# Patient Record
Sex: Female | Born: 1990
Health system: Southern US, Community
[De-identification: ages and names within clinical notes are randomized; demographics above are authoritative.]

## PROBLEM LIST (undated history)

## (undated) ENCOUNTER — Ambulatory Visit: Admission: EM | Payer: 59 | Source: Home / Self Care

## (undated) DIAGNOSIS — Z3491 Encounter for supervision of normal pregnancy, unspecified, first trimester: Principal | ICD-10-CM

## (undated) DIAGNOSIS — Z789 Other specified health status: Secondary | ICD-10-CM

## (undated) DIAGNOSIS — F419 Anxiety disorder, unspecified: Secondary | ICD-10-CM

## (undated) HISTORY — PX: NO PAST SURGERIES: SHX2092

## (undated) HISTORY — DX: Encounter for supervision of normal pregnancy, unspecified, first trimester: Z34.91

## (undated) HISTORY — DX: Other specified health status: Z78.9

---
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
## 2013-03-06 NOTE — L&D Delivery Note (Signed)
I have seen and examined this patient and I agree with the above. Cam Hai CNM 3:25 AM 11/15/2013

## 2013-03-06 NOTE — L&D Delivery Note (Signed)
Delivery Note Pt was noted to be complete and pushing. At 2:48 AM a viable female was delivered via Vaginal, Spontaneous Delivery (Presentation: ROA  ). Thick meconium noted after extraction of infant. Pt with good heart rate but no tone and agonal respirations. A code apgar was called.  APGAR: 3, 8 and NICU team present for fetal assessment. weight is pending.   Placenta status: intact.  Cord: 3 vessels with the following complications: .  Cord pH: n/a  Anesthesia: Epidural  Episiotomy: none Lacerations: 1st degree hemostatic perineal laceration Suture Repair: n/a Est. Blood Loss (mL): 300  Mom to postpartum.  Baby to Couplet care / Skin to Skin.  Philipp Deputy, CNM, present for entire delivery.  Tomma Rakers, MD, PGY3 11/15/2013, 3:07 AM

## 2013-04-10 ENCOUNTER — Other Ambulatory Visit: Payer: Self-pay | Admitting: Obstetrics & Gynecology

## 2013-04-10 DIAGNOSIS — O3680X0 Pregnancy with inconclusive fetal viability, not applicable or unspecified: Secondary | ICD-10-CM

## 2013-04-11 ENCOUNTER — Ambulatory Visit (INDEPENDENT_AMBULATORY_CARE_PROVIDER_SITE_OTHER): Payer: Medicaid Other

## 2013-04-11 DIAGNOSIS — O3680X0 Pregnancy with inconclusive fetal viability, not applicable or unspecified: Secondary | ICD-10-CM

## 2013-04-11 NOTE — Progress Notes (Signed)
U/S-single IUP with +FCA noted, FHR- 179 bpm, cx appears closed (3.4cm), bilateral adnexa wnl, CRL c/w 9+0wks EDD 11/14/2013

## 2013-04-18 ENCOUNTER — Other Ambulatory Visit (HOSPITAL_COMMUNITY)
Admission: RE | Admit: 2013-04-18 | Discharge: 2013-04-18 | Disposition: A | Discharge status: Home / Self Care | Payer: Medicaid Other | Source: Ambulatory Visit | Attending: Adult Health | Admitting: Adult Health

## 2013-04-18 ENCOUNTER — Ambulatory Visit (INDEPENDENT_AMBULATORY_CARE_PROVIDER_SITE_OTHER): Payer: Medicaid Other | Admitting: Adult Health

## 2013-04-18 ENCOUNTER — Encounter: Payer: Self-pay | Admitting: Adult Health

## 2013-04-18 VITALS — BP 118/60 | Ht 66.0 in | Wt 190.0 lb

## 2013-04-18 DIAGNOSIS — Z01419 Encounter for gynecological examination (general) (routine) without abnormal findings: Secondary | ICD-10-CM

## 2013-04-18 DIAGNOSIS — Z3400 Encounter for supervision of normal first pregnancy, unspecified trimester: Secondary | ICD-10-CM

## 2013-04-18 DIAGNOSIS — Z3491 Encounter for supervision of normal pregnancy, unspecified, first trimester: Secondary | ICD-10-CM

## 2013-04-18 DIAGNOSIS — Z1389 Encounter for screening for other disorder: Secondary | ICD-10-CM

## 2013-04-18 DIAGNOSIS — Z113 Encounter for screening for infections with a predominantly sexual mode of transmission: Secondary | ICD-10-CM | POA: Insufficient documentation

## 2013-04-18 DIAGNOSIS — Z331 Pregnant state, incidental: Secondary | ICD-10-CM

## 2013-04-18 HISTORY — DX: Encounter for supervision of normal pregnancy, unspecified, first trimester: Z34.91

## 2013-04-18 LAB — CBC
HEMATOCRIT: 37.2 % (ref 36.0–46.0)
HEMOGLOBIN: 12.9 g/dL (ref 12.0–15.0)
MCH: 29.7 pg (ref 26.0–34.0)
MCHC: 34.7 g/dL (ref 30.0–36.0)
MCV: 85.7 fL (ref 78.0–100.0)
Platelets: 279 10*3/uL (ref 150–400)
RBC: 4.34 MIL/uL (ref 3.87–5.11)
RDW: 12.9 % (ref 11.5–15.5)
WBC: 11.6 10*3/uL — AB (ref 4.0–10.5)

## 2013-04-18 LAB — POCT URINALYSIS DIPSTICK
Blood, UA: NEGATIVE
Glucose, UA: NEGATIVE
KETONES UA: NEGATIVE
Leukocytes, UA: NEGATIVE
Nitrite, UA: NEGATIVE
Protein, UA: NEGATIVE

## 2013-04-18 LAB — HEPATITIS B SURFACE ANTIGEN: Hepatitis B Surface Ag: NEGATIVE

## 2013-04-18 LAB — RPR

## 2013-04-18 LAB — TSH: TSH: 1.246 u[IU]/mL (ref 0.350–4.500)

## 2013-04-18 LAB — HIV ANTIBODY (ROUTINE TESTING W REFLEX): HIV: NONREACTIVE

## 2013-04-18 NOTE — Progress Notes (Signed)
Ears have wax bilaterally, use warm water to rinse out. Pt says she has chest pain since she was a child, this is not new.

## 2013-04-18 NOTE — Patient Instructions (Signed)

## 2013-04-18 NOTE — Progress Notes (Signed)
Subjective:  Alexandra Oneal is a 23 y.o. G1P0 Caucasian female at 5054w0d by US being seen today for her first obstetrical visit.  Her obstetrical history is significant for none.  Pregnancy history fully reviewed.  Patient reports nausea. Denies vb, cramping, uti s/s, abnormal/malodorous vag d/c, or vulvovaginal itching/irritation.  BP 118/60  Ht 5\' 6"  (1.676 m)  Wt 190 lb (86.183 kg)  BMI 30.68 kg/m2  HISTORY: OB History  Gravida Para Term Preterm AB SAB TAB Ectopic Multiple Living  1             # Outcome Date GA Lbr Len/2nd Weight Sex Delivery Anes PTL Lv  1 CUR              Past Medical History  Diagnosis Date  . Medical history non-contributory   . Supervision of normal pregnancy in first trimester 04/18/2013   Past Surgical History  Procedure Laterality Date  . No past surgeries     Family History  Problem Relation Age of Onset  . Heart disease Mother     heart attack  . Hypertension Mother   . Cancer Maternal Grandmother     breast  . Heart disease Maternal Grandfather     heart attack  . Alcohol abuse Paternal Grandfather   . Mental illness Sister     severe bipolar  . Mental illness Sister     Exam   System:     General: Well developed & nourished, no acute distress   Skin: Warm & dry, normal coloration and turgor, no rashes   Neurologic: Alert & oriented, normal mood   Cardiovascular: Regular rate & rhythm   Respiratory: Effort & rate normal, LCTAB, acyanotic   Abdomen: Soft, non tender   Extremities: normal strength, tone   Pelvic Exam:    Perineum: Normal perineum   Vulva: Normal, no lesions   Vagina:  Normal mucosa, normal discharge   Cervix: Normal, bulbous, appears closed   Uterus: Normal size/shape/contour for GA   Thin prep pap smear with GC/CHL FHR: 172 via doppler   Assessment:   Pregnancy: G1P0 Patient Active Problem List   Diagnosis Date Noted  . Supervision of normal pregnancy in first trimester 04/18/2013     7054w0d G1P0 New OB visit     Plan:  Initial labs drawn Continue prenatal vitamins Problem list reviewed and updated Reviewed n/v relief measures and warning s/s to report Reviewed recommended weight gain based on pre-gravid BMI Encouraged well-balanced diet Genetic Screening discussed Integrated Screen: requested Cystic fibrosis screening discussed requested Ultrasound discussed; fetal survey: requested Follow up in 2 weeks for IT/NT and see me  Adline PotterJennifer A. Verlyn Dannenberg, NP 04/18/2013 12:28 PM

## 2013-04-19 LAB — DRUG SCREEN, URINE, NO CONFIRMATION
Amphetamine Screen, Ur: NEGATIVE
Barbiturate Quant, Ur: NEGATIVE
Benzodiazepines.: NEGATIVE
COCAINE METABOLITES: NEGATIVE
Creatinine,U: 204.9 mg/dL
MARIJUANA METABOLITE: NEGATIVE
Methadone: NEGATIVE
Opiate Screen, Urine: NEGATIVE
PHENCYCLIDINE (PCP): NEGATIVE
Propoxyphene: NEGATIVE

## 2013-04-19 LAB — URINALYSIS, ROUTINE W REFLEX MICROSCOPIC
Bilirubin Urine: NEGATIVE
GLUCOSE, UA: NEGATIVE mg/dL
Hgb urine dipstick: NEGATIVE
Ketones, ur: NEGATIVE mg/dL
LEUKOCYTES UA: NEGATIVE
NITRITE: NEGATIVE
PH: 5 (ref 5.0–8.0)
Protein, ur: NEGATIVE mg/dL
Specific Gravity, Urine: 1.023 (ref 1.005–1.030)
Urobilinogen, UA: 0.2 mg/dL (ref 0.0–1.0)

## 2013-04-19 LAB — URINE CULTURE: Colony Count: 9000

## 2013-04-19 LAB — ABO AND RH: Rh Type: POSITIVE

## 2013-04-19 LAB — OXYCODONE SCREEN, UA, RFLX CONFIRM: Oxycodone Screen, Ur: NEGATIVE ng/mL

## 2013-04-19 LAB — VARICELLA ZOSTER ANTIBODY, IGG: VARICELLA IGG: 503.8 {index} — AB (ref <135.00)

## 2013-04-19 LAB — RUBELLA SCREEN: RUBELLA: 1.32 {index} — AB (ref <0.90)

## 2013-04-19 LAB — ANTIBODY SCREEN: ANTIBODY SCREEN: NEGATIVE

## 2013-04-22 LAB — CYSTIC FIBROSIS DIAGNOSTIC STUDY

## 2013-04-23 ENCOUNTER — Encounter: Payer: Self-pay | Admitting: Adult Health

## 2013-04-30 ENCOUNTER — Telehealth: Payer: Self-pay | Admitting: Obstetrics & Gynecology

## 2013-04-30 NOTE — Telephone Encounter (Signed)
Spoke with pt. + cramping. Sharp pains in bottom of stomach. Severe backpain. No bleeding. Advised with no bleeding, everything sounds good at this point. I spoke with Selena BattenKim, CNM and she advised Tylenol and warm baths. If she started with bleeding, let us know or if we're not in the office, [facility]. Again advised with no bleeding, that is a good sign. Pt voiced understanding. JSY

## 2013-04-30 NOTE — Telephone Encounter (Signed)
Left message x 1. JSY 

## 2013-05-02 ENCOUNTER — Encounter: Payer: Self-pay | Admitting: Adult Health

## 2013-05-02 ENCOUNTER — Other Ambulatory Visit: Payer: Self-pay | Admitting: Adult Health

## 2013-05-02 ENCOUNTER — Ambulatory Visit (INDEPENDENT_AMBULATORY_CARE_PROVIDER_SITE_OTHER): Payer: Medicaid Other | Admitting: Adult Health

## 2013-05-02 ENCOUNTER — Ambulatory Visit (INDEPENDENT_AMBULATORY_CARE_PROVIDER_SITE_OTHER): Payer: Medicaid Other

## 2013-05-02 VITALS — BP 124/70 | Wt 186.5 lb

## 2013-05-02 DIAGNOSIS — O9989 Other specified diseases and conditions complicating pregnancy, childbirth and the puerperium: Secondary | ICD-10-CM

## 2013-05-02 DIAGNOSIS — Z1389 Encounter for screening for other disorder: Secondary | ICD-10-CM

## 2013-05-02 DIAGNOSIS — H6690 Otitis media, unspecified, unspecified ear: Secondary | ICD-10-CM

## 2013-05-02 DIAGNOSIS — Z331 Pregnant state, incidental: Secondary | ICD-10-CM

## 2013-05-02 DIAGNOSIS — Z3491 Encounter for supervision of normal pregnancy, unspecified, first trimester: Secondary | ICD-10-CM

## 2013-05-02 DIAGNOSIS — Z36 Encounter for antenatal screening of mother: Secondary | ICD-10-CM

## 2013-05-02 LAB — POCT URINALYSIS DIPSTICK
Blood, UA: NEGATIVE
Glucose, UA: NEGATIVE
Ketones, UA: NEGATIVE
Leukocytes, UA: NEGATIVE
Nitrite, UA: NEGATIVE
PROTEIN UA: NEGATIVE

## 2013-05-02 MED ORDER — AZITHROMYCIN 250 MG PO TABS: ORAL_TABLET | ORAL | Status: DC

## 2013-05-02 NOTE — Patient Instructions (Signed)
Second Trimester of Pregnancy The second trimester is from week 13 through week 28, months 4 through 6. The second trimester is often a time when you feel your best. Your body has also adjusted to being pregnant, and you begin to feel better physically. Usually, morning sickness has lessened or quit completely, you may have more energy, and you may have an increase in appetite. The second trimester is also a time when the fetus is growing rapidly. At the end of the sixth month, the fetus is about 9 inches long and weighs about 1 pounds. You will likely begin to feel the baby move (quickening) between 18 and 20 weeks of the pregnancy. BODY CHANGES Your body goes through many changes during pregnancy. The changes vary from woman to woman.   Your weight will continue to increase. You will notice your lower abdomen bulging out.  You may begin to get stretch marks on your hips, abdomen, and breasts.  You may develop headaches that can be relieved by medicines approved by your caregiver.  You may urinate more often because the fetus is pressing on your bladder.  You may develop or continue to have heartburn as a result of your pregnancy.  You may develop constipation because certain hormones are causing the muscles that push waste through your intestines to slow down.  You may develop hemorrhoids or swollen, bulging veins (varicose veins).  You may have back pain because of the weight gain and pregnancy hormones relaxing your joints between the bones in your pelvis and as a result of a shift in weight and the muscles that support your balance.  Your breasts will continue to grow and be tender.  Your gums may bleed and may be sensitive to brushing and flossing.  Dark spots or blotches (chloasma, mask of pregnancy) may develop on your face. This will likely fade after the baby is born.  A dark line from your belly button to the pubic area (linea nigra) may appear. This will likely fade after the  baby is born.follow up in 4 weeks WHAT TO EXPECT AT YOUR PRENATAL VISITS During a routine prenatal visit:  You will be weighed to make sure you and the fetus are growing normally.  Your blood pressure will be taken.  Your abdomen will be measured to track your baby's growth.  The fetal heartbeat will be listened to.  Any test results from the previous visit will be discussed. Your caregiver may ask you:  How you are feeling.  If you are feeling the baby move.  If you have had any abnormal symptoms, such as leaking fluid, bleeding, severe headaches, or abdominal cramping.  If you have any questions. Other tests that may be performed during your second trimester include:  Blood tests that check for:  Low iron levels (anemia).  Gestational diabetes (between 24 and 28 weeks).  Rh antibodies.  Urine tests to check for infections, diabetes, or protein in the urine.  An ultrasound to confirm the proper growth and development of the baby.  An amniocentesis to check for possible genetic problems.  Fetal screens for spina bifida and Down syndrome. HOME CARE INSTRUCTIONS   Avoid all smoking, herbs, alcohol, and unprescribed drugs. These chemicals affect the formation and growth of the baby.  Follow your caregiver's instructions regarding medicine use. There are medicines that are either safe or unsafe to take during pregnancy.  Exercise only as directed by your caregiver. Experiencing uterine cramps is a good sign to stop exercising.  Continue to eat regular, healthy meals.  Wear a good support bra for breast tenderness.  Do not use hot tubs, steam rooms, or saunas.  Wear your seat belt at all times when driving.  Avoid raw meat, uncooked cheese, cat litter boxes, and soil used by cats. These carry germs that can cause birth defects in the baby.  Take your prenatal vitamins.  Try taking a stool softener (if your caregiver approves) if you develop constipation. Eat more  high-fiber foods, such as fresh vegetables or fruit and whole grains. Drink plenty of fluids to keep your urine clear or pale yellow.  Take warm sitz baths to soothe any pain or discomfort caused by hemorrhoids. Use hemorrhoid cream if your caregiver approves.  If you develop varicose veins, wear support hose. Elevate your feet for 15 minutes, 3 4 times a day. Limit salt in your diet.  Avoid heavy lifting, wear low heel shoes, and practice good posture.  Rest with your legs elevated if you have leg cramps or low back pain.  Visit your dentist if you have not gone yet during your pregnancy. Use a soft toothbrush to brush your teeth and be gentle when you floss.  A sexual relationship may be continued unless your caregiver directs you otherwise.  Continue to go to all your prenatal visits as directed by your caregiver. SEEK MEDICAL CARE IF:   You have dizziness.  You have mild pelvic cramps, pelvic pressure, or nagging pain in the abdominal area.  You have persistent nausea, vomiting, or diarrhea.  You have a bad smelling vaginal discharge.  You have pain with urination. SEEK IMMEDIATE MEDICAL CARE IF:   You have a fever.  You are leaking fluid from your vagina.  You have spotting or bleeding from your vagina.  You have severe abdominal cramping or pain.  You have rapid weight gain or loss.  You have shortness of breath with chest pain.  You notice sudden or extreme swelling of your face, hands, ankles, feet, or legs.  You have not felt your baby move in over an hour.  You have severe headaches that do not go away with medicine.  You have vision changes. Document Released: 02/14/2001 Document Revised: 10/23/2012 Document Reviewed: 04/23/2012 [facility] Patient Information 2014 [place].

## 2013-05-02 NOTE — Progress Notes (Signed)
Complains of pain right ear, ear canal red, less wax, has nasal congestion and green drainage,snezing too,no bleeding, had some cramps last night,will rx zpack and increase fluids.Has gotten humidifier.take Claritin or zyrtec.For IT/NT today return in 4 weeks for second IT draw

## 2013-05-02 NOTE — Progress Notes (Signed)
U/S(12+0wks)-single IUP with +FCA noted, FHR-167 bpm, cx appears long and closed (3.3cm), bilateral adnexa wnl, NB present, NT-1.8747mm, CRL c/w dates, anterior Gr 0 placenta

## 2013-05-07 LAB — MATERNAL SCREEN, INTEGRATED #1

## 2013-05-30 ENCOUNTER — Ambulatory Visit (INDEPENDENT_AMBULATORY_CARE_PROVIDER_SITE_OTHER): Payer: Medicaid Other | Admitting: Adult Health

## 2013-05-30 ENCOUNTER — Encounter (INDEPENDENT_AMBULATORY_CARE_PROVIDER_SITE_OTHER): Payer: Self-pay

## 2013-05-30 ENCOUNTER — Encounter: Payer: Self-pay | Admitting: Adult Health

## 2013-05-30 VITALS — BP 118/50 | Wt 181.5 lb

## 2013-05-30 DIAGNOSIS — Z331 Pregnant state, incidental: Secondary | ICD-10-CM

## 2013-05-30 DIAGNOSIS — O9989 Other specified diseases and conditions complicating pregnancy, childbirth and the puerperium: Secondary | ICD-10-CM

## 2013-05-30 DIAGNOSIS — Z1389 Encounter for screening for other disorder: Secondary | ICD-10-CM

## 2013-05-30 DIAGNOSIS — Z3400 Encounter for supervision of normal first pregnancy, unspecified trimester: Secondary | ICD-10-CM

## 2013-05-30 LAB — POCT URINALYSIS DIPSTICK
Blood, UA: NEGATIVE
Glucose, UA: NEGATIVE
KETONES UA: NEGATIVE
LEUKOCYTES UA: NEGATIVE
Nitrite, UA: NEGATIVE
Protein, UA: NEGATIVE

## 2013-05-30 NOTE — Progress Notes (Signed)
+  FHM on US 155, last night episode of feeling faint and clammy, eat every 2 hours with protein and fiber.2nd IT today will follow up in 4 weeks for US,No bleeding ,No pain

## 2013-05-30 NOTE — Addendum Note (Signed)
Addended by: Criss AlvinePULLIAM, Lorena Clearman G on: 05/30/2013 01:14 PM   Modules accepted: Orders

## 2013-05-30 NOTE — Patient Instructions (Signed)
Follow-up in 4 weeks for US

## 2013-06-03 ENCOUNTER — Encounter: Payer: Self-pay | Admitting: Obstetrics & Gynecology

## 2013-06-03 ENCOUNTER — Encounter (INDEPENDENT_AMBULATORY_CARE_PROVIDER_SITE_OTHER): Payer: Self-pay

## 2013-06-03 ENCOUNTER — Ambulatory Visit (INDEPENDENT_AMBULATORY_CARE_PROVIDER_SITE_OTHER): Payer: Medicaid Other | Admitting: Obstetrics & Gynecology

## 2013-06-03 VITALS — BP 110/70 | Wt 186.0 lb

## 2013-06-03 DIAGNOSIS — O9989 Other specified diseases and conditions complicating pregnancy, childbirth and the puerperium: Secondary | ICD-10-CM

## 2013-06-03 DIAGNOSIS — Z1389 Encounter for screening for other disorder: Secondary | ICD-10-CM

## 2013-06-03 DIAGNOSIS — F411 Generalized anxiety disorder: Secondary | ICD-10-CM

## 2013-06-03 DIAGNOSIS — Z331 Pregnant state, incidental: Secondary | ICD-10-CM

## 2013-06-03 LAB — POCT URINALYSIS DIPSTICK
Blood, UA: NEGATIVE
Glucose, UA: NEGATIVE
Ketones, UA: NEGATIVE
Leukocytes, UA: NEGATIVE
Nitrite, UA: NEGATIVE
PROTEIN UA: NEGATIVE

## 2013-06-03 NOTE — Progress Notes (Signed)
Pt having episodic anxiety, panic attacks with SOB, tachycardia visual changes etc Given strategies Fears of elevators and bridges also  Keep scheduled

## 2013-06-05 LAB — MATERNAL SCREEN, INTEGRATED #2
AFP MOM MAT SCREEN: 0.89
AFP, Serum: 26.7 ng/mL
Calculated Gestational Age: 16.3
Crown Rump Length: 59.2 mm
ESTRIOL FREE MAT SCREEN: 0.83 ng/mL
Estriol Mom: 1
INHIBIN A MOM MAT SCREEN: 1.98
Inhibin A Dimeric: 306 pg/mL
NT MoM: 1.07
NUCHAL TRANSLUCENCY MAT SCREEN 2: 1.47 mm
NUMBER OF FETUSES MAT SCREEN 2: 1
PAPP-A MAT SCREEN: 408 ng/mL
PAPP-A MOM MAT SCREEN: 0.62
Rish for ONTD: <1:5000 {titer}
hCG MoM: 1.11
hCG, Serum: 32.7 IU/mL

## 2013-06-09 ENCOUNTER — Encounter: Payer: Self-pay | Admitting: Adult Health

## 2013-06-24 ENCOUNTER — Other Ambulatory Visit: Payer: Self-pay | Admitting: Obstetrics & Gynecology

## 2013-06-24 DIAGNOSIS — Z1389 Encounter for screening for other disorder: Secondary | ICD-10-CM

## 2013-06-27 ENCOUNTER — Ambulatory Visit (INDEPENDENT_AMBULATORY_CARE_PROVIDER_SITE_OTHER): Payer: Medicaid Other | Admitting: Obstetrics & Gynecology

## 2013-06-27 ENCOUNTER — Encounter: Payer: Self-pay | Admitting: Obstetrics & Gynecology

## 2013-06-27 ENCOUNTER — Ambulatory Visit (INDEPENDENT_AMBULATORY_CARE_PROVIDER_SITE_OTHER): Payer: Medicaid Other

## 2013-06-27 VITALS — BP 138/78 | Wt 194.5 lb

## 2013-06-27 DIAGNOSIS — Z1389 Encounter for screening for other disorder: Secondary | ICD-10-CM

## 2013-06-27 DIAGNOSIS — Z331 Pregnant state, incidental: Secondary | ICD-10-CM

## 2013-06-27 DIAGNOSIS — Z3400 Encounter for supervision of normal first pregnancy, unspecified trimester: Secondary | ICD-10-CM

## 2013-06-27 LAB — POCT URINALYSIS DIPSTICK
Blood, UA: NEGATIVE
Glucose, UA: NEGATIVE
KETONES UA: NEGATIVE
LEUKOCYTES UA: NEGATIVE
Nitrite, UA: NEGATIVE
Protein, UA: NEGATIVE

## 2013-06-27 NOTE — Progress Notes (Signed)
Sonogram is reviewed and report done.  All normal

## 2013-06-27 NOTE — Progress Notes (Signed)
U/S(20+0wks)-single active fetus, meas c/w dates, fluid wnl,, anterior Gr 0 placenta, cx appears closed (3.4cm), FHR-156 bpm, female fetus, bilateral adnexa appears wnl, no obvious abnl noted sub-optimal views due to fetal position

## 2013-07-25 ENCOUNTER — Ambulatory Visit (INDEPENDENT_AMBULATORY_CARE_PROVIDER_SITE_OTHER): Payer: Self-pay | Admitting: Obstetrics & Gynecology

## 2013-07-25 ENCOUNTER — Encounter: Payer: Self-pay | Admitting: Obstetrics & Gynecology

## 2013-07-25 VITALS — BP 120/70 | Wt 202.0 lb

## 2013-07-25 DIAGNOSIS — Z3400 Encounter for supervision of normal first pregnancy, unspecified trimester: Secondary | ICD-10-CM

## 2013-07-25 NOTE — Progress Notes (Signed)
G1P0 [redacted]w[redacted]d Estimated Date of Delivery: 11/14/13   BP weight and urine results all reviewed and noted. Patient reports good fetal movement, denies any bleeding and no rupture of membranes symptoms or regular contractions.  Patient is without complaints. All questions were answered.  Follow up in 4 weeks for routine OB appt

## 2013-08-22 ENCOUNTER — Encounter: Payer: Medicaid Other | Admitting: Obstetrics and Gynecology

## 2013-08-22 ENCOUNTER — Other Ambulatory Visit: Payer: Medicaid Other

## 2013-08-22 DIAGNOSIS — Z3403 Encounter for supervision of normal first pregnancy, third trimester: Secondary | ICD-10-CM

## 2013-08-22 LAB — CBC
HCT: 34.9 % — ABNORMAL LOW (ref 36.0–46.0)
Hemoglobin: 11.9 g/dL — ABNORMAL LOW (ref 12.0–15.0)
MCH: 30.2 pg (ref 26.0–34.0)
MCHC: 34.1 g/dL (ref 30.0–36.0)
MCV: 88.6 fL (ref 78.0–100.0)
PLATELETS: 231 10*3/uL (ref 150–400)
RBC: 3.94 MIL/uL (ref 3.87–5.11)
RDW: 13.1 % (ref 11.5–15.5)
WBC: 12.8 10*3/uL — ABNORMAL HIGH (ref 4.0–10.5)

## 2013-08-23 LAB — ANTIBODY SCREEN: ANTIBODY SCREEN: NEGATIVE

## 2013-08-23 LAB — GLUCOSE TOLERANCE, 2 HOURS W/ 1HR
GLUCOSE: 152 mg/dL (ref 70–170)
Glucose, 2 hour: 119 mg/dL (ref 70–139)
Glucose, Fasting: 90 mg/dL (ref 70–99)

## 2013-08-23 LAB — RPR

## 2013-08-23 LAB — HIV ANTIBODY (ROUTINE TESTING W REFLEX): HIV: NONREACTIVE

## 2013-08-25 ENCOUNTER — Encounter: Payer: Self-pay | Admitting: Obstetrics and Gynecology

## 2013-08-25 ENCOUNTER — Ambulatory Visit (INDEPENDENT_AMBULATORY_CARE_PROVIDER_SITE_OTHER): Payer: Self-pay | Admitting: Obstetrics and Gynecology

## 2013-08-25 ENCOUNTER — Encounter: Payer: Self-pay | Admitting: Adult Health

## 2013-08-25 VITALS — BP 120/80 | Wt 211.0 lb

## 2013-08-25 DIAGNOSIS — Z3491 Encounter for supervision of normal pregnancy, unspecified, first trimester: Secondary | ICD-10-CM

## 2013-08-25 DIAGNOSIS — Z3480 Encounter for supervision of other normal pregnancy, unspecified trimester: Secondary | ICD-10-CM

## 2013-08-25 DIAGNOSIS — Z1389 Encounter for screening for other disorder: Secondary | ICD-10-CM

## 2013-08-25 DIAGNOSIS — Z331 Pregnant state, incidental: Secondary | ICD-10-CM

## 2013-08-25 LAB — POCT URINALYSIS DIPSTICK
Blood, UA: NEGATIVE
Glucose, UA: NEGATIVE
KETONES UA: NEGATIVE
Leukocytes, UA: NEGATIVE
Nitrite, UA: NEGATIVE
Protein, UA: NEGATIVE

## 2013-08-25 LAB — HSV 2 ANTIBODY, IGG: HSV 2 Glycoprotein G Ab, IgG: <0.1 IV

## 2013-08-25 NOTE — Progress Notes (Signed)
Pt states that she has a constant pain "down there " that feels like a pulled muscle and hurts really bad.

## 2013-08-25 NOTE — Patient Instructions (Signed)
Please check out [email or web address]   for more information on childbirth classes

## 2013-08-25 NOTE — Progress Notes (Signed)
G1P0 4347w3d Estimated Date of Delivery: 11/14/13  Blood pressure 120/80, weight 211 lb (95.709 kg).   BP weight and urine results all reviewed see ob flow sheet for FH and FHR- reviewed  ROS negative except: Patient complains of mild, pulling pain to the suprapubic region. She denies vaginal bleeding or discharge.   Plan:  Continued routine obstetrical care including weight management with remainder of pregnancy.   Follow up in 2 weeks for [facility]

## 2013-09-08 ENCOUNTER — Encounter: Payer: Self-pay | Admitting: Obstetrics & Gynecology

## 2013-09-08 ENCOUNTER — Ambulatory Visit (INDEPENDENT_AMBULATORY_CARE_PROVIDER_SITE_OTHER): Payer: Self-pay | Admitting: Obstetrics & Gynecology

## 2013-09-08 VITALS — BP 110/70 | Wt 213.0 lb

## 2013-09-08 DIAGNOSIS — Z3480 Encounter for supervision of other normal pregnancy, unspecified trimester: Secondary | ICD-10-CM

## 2013-09-08 DIAGNOSIS — Z3483 Encounter for supervision of other normal pregnancy, third trimester: Secondary | ICD-10-CM

## 2013-09-08 NOTE — Progress Notes (Signed)
G1P0 6810w3d Estimated Date of Delivery: 11/14/13  Blood pressure 110/70, weight 213 lb (96.616 kg).   BP weight and urine results all reviewed and noted.  Please refer to the obstetrical flow sheet for the fundal height and fetal heart rate documentation:  Patient reports good fetal movement, denies any bleeding and no rupture of membranes symptoms or regular contractions. Patient is without complaints. All questions were answered.  Plan:  Continued routine obstetrical care,   Follow up in 2 weeks for OB appointment, routine

## 2013-09-25 ENCOUNTER — Ambulatory Visit (INDEPENDENT_AMBULATORY_CARE_PROVIDER_SITE_OTHER): Payer: Self-pay | Admitting: Obstetrics & Gynecology

## 2013-09-25 ENCOUNTER — Encounter: Payer: Medicaid Other | Admitting: Obstetrics & Gynecology

## 2013-09-25 VITALS — BP 120/70 | Wt 218.0 lb

## 2013-09-25 DIAGNOSIS — Z331 Pregnant state, incidental: Secondary | ICD-10-CM

## 2013-09-25 DIAGNOSIS — Z3400 Encounter for supervision of normal first pregnancy, unspecified trimester: Secondary | ICD-10-CM

## 2013-09-25 DIAGNOSIS — Z1389 Encounter for screening for other disorder: Secondary | ICD-10-CM

## 2013-09-25 LAB — POCT URINALYSIS DIPSTICK
Blood, UA: NEGATIVE
Glucose, UA: NEGATIVE
Ketones, UA: NEGATIVE
Leukocytes, UA: NEGATIVE
Nitrite, UA: NEGATIVE
Protein, UA: NEGATIVE

## 2013-09-25 MED ORDER — OMEPRAZOLE 20 MG PO CPDR: 20.0000 mg | DELAYED_RELEASE_CAPSULE | Freq: Every day | ORAL | Status: DC

## 2013-09-25 NOTE — Progress Notes (Signed)
G1P0 5614w6d Estimated Date of Delivery: 11/14/13  Blood pressure 120/70, weight 218 lb (98.884 kg).   BP weight and urine results all reviewed and noted.  Please refer to the obstetrical flow sheet for the fundal height and fetal heart rate documentation:  Patient reports good fetal movement, denies any bleeding and no rupture of membranes symptoms or regular contractions. Patient is without complaints. All questions were answered.  Plan:  Continued routine obstetrical care,   Follow up in 2 weeks for OB appointment, routine  omeperazole 20 qd

## 2013-10-01 ENCOUNTER — Telehealth: Payer: Self-pay | Admitting: Women's Health

## 2013-10-08 NOTE — Telephone Encounter (Signed)
Pt states was calling due to blood pressure issues but she went on the the ED and was told she was dehydrated and they gave her fluids.  She is scheduled to see us tomorrow and will talk to Dr. Despina HiddenEure about everything at that time.

## 2013-10-09 ENCOUNTER — Encounter: Payer: Self-pay | Admitting: Obstetrics & Gynecology

## 2013-10-09 ENCOUNTER — Ambulatory Visit (INDEPENDENT_AMBULATORY_CARE_PROVIDER_SITE_OTHER): Payer: Self-pay | Admitting: Obstetrics & Gynecology

## 2013-10-09 VITALS — BP 110/60 | Wt 225.0 lb

## 2013-10-09 DIAGNOSIS — Z1389 Encounter for screening for other disorder: Secondary | ICD-10-CM

## 2013-10-09 DIAGNOSIS — Z331 Pregnant state, incidental: Secondary | ICD-10-CM

## 2013-10-09 DIAGNOSIS — Z3403 Encounter for supervision of normal first pregnancy, third trimester: Secondary | ICD-10-CM

## 2013-10-09 DIAGNOSIS — Z3400 Encounter for supervision of normal first pregnancy, unspecified trimester: Secondary | ICD-10-CM

## 2013-10-09 LAB — POCT URINALYSIS DIPSTICK
Blood, UA: NEGATIVE
Ketones, UA: NEGATIVE
LEUKOCYTES UA: NEGATIVE
NITRITE UA: NEGATIVE
PROTEIN UA: NEGATIVE

## 2013-10-09 NOTE — Progress Notes (Signed)
G1P0 5213w6d Estimated Date of Delivery: 11/14/13  Blood pressure 110/60, weight 225 lb (102.059 kg).   BP weight and urine results all reviewed and noted.  Please refer to the obstetrical flow sheet for the fundal height and fetal heart rate documentation:  Patient reports good fetal movement, denies any bleeding and no rupture of membranes symptoms or regular contractions. Patient is without complaints. All questions were answered.  Plan:  Continued routine obstetrical care,   Follow up in 2 weeks for OB appointment, routine

## 2013-10-09 NOTE — Addendum Note (Signed)
Addended by: Criss AlvinePULLIAM, Gautam Langhorst G on: 10/09/2013 03:48 PM   Modules accepted: Orders

## 2013-10-23 ENCOUNTER — Ambulatory Visit (INDEPENDENT_AMBULATORY_CARE_PROVIDER_SITE_OTHER): Payer: Self-pay | Admitting: Obstetrics & Gynecology

## 2013-10-23 ENCOUNTER — Encounter: Payer: Self-pay | Admitting: Obstetrics & Gynecology

## 2013-10-23 VITALS — BP 110/80 | Wt 228.0 lb

## 2013-10-23 DIAGNOSIS — Z331 Pregnant state, incidental: Secondary | ICD-10-CM

## 2013-10-23 DIAGNOSIS — Z3685 Encounter for antenatal screening for Streptococcus B: Secondary | ICD-10-CM

## 2013-10-23 DIAGNOSIS — Z3403 Encounter for supervision of normal first pregnancy, third trimester: Secondary | ICD-10-CM

## 2013-10-23 DIAGNOSIS — Z1389 Encounter for screening for other disorder: Secondary | ICD-10-CM

## 2013-10-23 DIAGNOSIS — Z1159 Encounter for screening for other viral diseases: Secondary | ICD-10-CM

## 2013-10-23 DIAGNOSIS — Z3400 Encounter for supervision of normal first pregnancy, unspecified trimester: Secondary | ICD-10-CM

## 2013-10-23 LAB — OB RESULTS CONSOLE GC/CHLAMYDIA
Chlamydia: NEGATIVE
Gonorrhea: NEGATIVE

## 2013-10-23 LAB — POCT URINALYSIS DIPSTICK
Blood, UA: NEGATIVE
Glucose, UA: NEGATIVE
KETONES UA: NEGATIVE
Leukocytes, UA: NEGATIVE
Nitrite, UA: NEGATIVE
PROTEIN UA: NEGATIVE

## 2013-10-23 NOTE — Addendum Note (Signed)
Addended by: Richardson ChiquitoRAVIS, ASHLEY M on: 10/23/2013 04:25 PM   Modules accepted: Orders

## 2013-10-23 NOTE — Progress Notes (Signed)
G1P0 2550w6d Estimated Date of Delivery: 11/14/13  Blood pressure 110/80, weight 228 lb (103.42 kg).   BP weight and urine results all reviewed and noted.  Please refer to the obstetrical flow sheet for the fundal height and fetal heart rate documentation:  Patient reports good fetal movement, denies any bleeding and no rupture of membranes symptoms or regular contractions. Patient is without complaints. All questions were answered.  Plan:  Continued routine obstetrical care,   Follow up in 1 weeks for OB appointment, routine

## 2013-10-24 LAB — GC/CHLAMYDIA PROBE AMP
CT Probe RNA: NEGATIVE
GC Probe RNA: NEGATIVE

## 2013-10-25 LAB — STREP B DNA PROBE: GBSP: NOT DETECTED

## 2013-10-30 ENCOUNTER — Ambulatory Visit (INDEPENDENT_AMBULATORY_CARE_PROVIDER_SITE_OTHER): Payer: Medicaid Other | Admitting: Obstetrics & Gynecology

## 2013-10-30 ENCOUNTER — Encounter: Payer: Self-pay | Admitting: Obstetrics & Gynecology

## 2013-10-30 VITALS — BP 122/80 | Wt 232.0 lb

## 2013-10-30 DIAGNOSIS — Z1389 Encounter for screening for other disorder: Secondary | ICD-10-CM

## 2013-10-30 DIAGNOSIS — Z3400 Encounter for supervision of normal first pregnancy, unspecified trimester: Secondary | ICD-10-CM

## 2013-10-30 DIAGNOSIS — Z3403 Encounter for supervision of normal first pregnancy, third trimester: Secondary | ICD-10-CM

## 2013-10-30 DIAGNOSIS — Z331 Pregnant state, incidental: Secondary | ICD-10-CM

## 2013-10-30 LAB — POCT URINALYSIS DIPSTICK
Glucose, UA: NEGATIVE
Ketones, UA: NEGATIVE
Leukocytes, UA: NEGATIVE
Nitrite, UA: NEGATIVE
Protein, UA: NEGATIVE
RBC UA: NEGATIVE

## 2013-10-30 NOTE — Progress Notes (Signed)
G1P0 [redacted]w[redacted]d Estimated Date of Delivery: 11/14/13  Blood pressure 122/80, weight 232 lb (105.235 kg).   BP weight and urine results all reviewed and noted.  Please refer to the obstetrical flow sheet for the fundal height and fetal heart rate documentation:  Patient reports good fetal movement, denies any bleeding and no rupture of membranes symptoms or regular contractions. Patient is without complaints. All questions were answered.  Plan:  Continued routine obstetrical care,   Follow up in 1 weeks for OB appointment, routine

## 2013-11-02 ENCOUNTER — Encounter: Payer: Self-pay | Admitting: Obstetrics & Gynecology

## 2013-11-03 DIAGNOSIS — Z0289 Encounter for other administrative examinations: Secondary | ICD-10-CM

## 2013-11-06 ENCOUNTER — Ambulatory Visit (INDEPENDENT_AMBULATORY_CARE_PROVIDER_SITE_OTHER): Payer: Medicaid Other | Admitting: Obstetrics & Gynecology

## 2013-11-06 ENCOUNTER — Encounter: Payer: Self-pay | Admitting: Obstetrics & Gynecology

## 2013-11-06 VITALS — BP 120/80 | Wt 231.0 lb

## 2013-11-06 DIAGNOSIS — Z331 Pregnant state, incidental: Secondary | ICD-10-CM

## 2013-11-06 DIAGNOSIS — Z3400 Encounter for supervision of normal first pregnancy, unspecified trimester: Secondary | ICD-10-CM

## 2013-11-06 DIAGNOSIS — Z1389 Encounter for screening for other disorder: Secondary | ICD-10-CM

## 2013-11-06 DIAGNOSIS — Z3403 Encounter for supervision of normal first pregnancy, third trimester: Secondary | ICD-10-CM

## 2013-11-06 LAB — POCT URINALYSIS DIPSTICK
Blood, UA: NEGATIVE
GLUCOSE UA: NEGATIVE
KETONES UA: NEGATIVE
Leukocytes, UA: NEGATIVE
Nitrite, UA: NEGATIVE
Protein, UA: NEGATIVE

## 2013-11-06 NOTE — Progress Notes (Signed)
G1P0 [redacted]w[redacted]d Estimated Date of Delivery: 11/14/13  Blood pressure 120/80, weight 231 lb (104.781 kg).   BP weight and urine results all reviewed and noted.  Please refer to the obstetrical flow sheet for the fundal height and fetal heart rate documentation:  Patient reports good fetal movement, denies any bleeding and no rupture of membranes symptoms or regular contractions. Patient is without complaints. All questions were answered.  Plan:  Continued routine obstetrical care,   Follow up in 1 weeks for OB appointment, routine

## 2013-11-13 ENCOUNTER — Encounter: Payer: Self-pay | Admitting: Obstetrics & Gynecology

## 2013-11-13 ENCOUNTER — Ambulatory Visit (INDEPENDENT_AMBULATORY_CARE_PROVIDER_SITE_OTHER): Payer: Medicaid Other | Admitting: Obstetrics & Gynecology

## 2013-11-13 ENCOUNTER — Telehealth (HOSPITAL_COMMUNITY): Payer: Self-pay | Admitting: *Deleted

## 2013-11-13 VITALS — BP 120/70 | Wt 233.0 lb

## 2013-11-13 DIAGNOSIS — Z1389 Encounter for screening for other disorder: Secondary | ICD-10-CM

## 2013-11-13 DIAGNOSIS — Z3400 Encounter for supervision of normal first pregnancy, unspecified trimester: Secondary | ICD-10-CM

## 2013-11-13 DIAGNOSIS — Z3403 Encounter for supervision of normal first pregnancy, third trimester: Secondary | ICD-10-CM

## 2013-11-13 DIAGNOSIS — Z331 Pregnant state, incidental: Secondary | ICD-10-CM

## 2013-11-13 LAB — POCT URINALYSIS DIPSTICK
Blood, UA: NEGATIVE
Glucose, UA: NEGATIVE
KETONES UA: NEGATIVE
LEUKOCYTES UA: NEGATIVE
Nitrite, UA: NEGATIVE
Protein, UA: NEGATIVE

## 2013-11-13 NOTE — Telephone Encounter (Signed)
Preadmission screen  

## 2013-11-13 NOTE — Progress Notes (Signed)
Post dates Induction scheduled for 11/21/2013 at 0730  G1P0 103w6d Estimated Date of Delivery: 11/14/13  Blood pressure 120/70, weight 233 lb (105.688 kg).   BP weight and urine results all reviewed and noted.  Please refer to the obstetrical flow sheet for the fundal height and fetal heart rate documentation:  Patient reports good fetal movement, denies any bleeding and no rupture of membranes symptoms or regular contractions. Patient is without complaints. All questions were answered.  Plan:  Continued routine obstetrical care,   Follow up in 6 weeks for OB appointment, pp exam

## 2013-11-14 ENCOUNTER — Inpatient Hospital Stay (HOSPITAL_COMMUNITY)
Admission: AD | Admit: 2013-11-14 | Discharge: 2013-11-17 | DRG: 775 | Disposition: A | Discharge status: Home / Self Care | Payer: Medicaid Other | Source: Ambulatory Visit | Attending: Obstetrics & Gynecology | Admitting: Obstetrics & Gynecology

## 2013-11-14 ENCOUNTER — Inpatient Hospital Stay (HOSPITAL_COMMUNITY): Admission: RE | Admit: 2013-11-14 | Payer: Medicaid Other | Source: Ambulatory Visit

## 2013-11-14 ENCOUNTER — Encounter (HOSPITAL_COMMUNITY): Payer: Medicaid Other | Admitting: Anesthesiology

## 2013-11-14 ENCOUNTER — Inpatient Hospital Stay (HOSPITAL_COMMUNITY): Payer: Medicaid Other | Admitting: Anesthesiology

## 2013-11-14 ENCOUNTER — Encounter (HOSPITAL_COMMUNITY): Payer: Self-pay | Admitting: *Deleted

## 2013-11-14 DIAGNOSIS — O99214 Obesity complicating childbirth: Secondary | ICD-10-CM

## 2013-11-14 DIAGNOSIS — Z6837 Body mass index (BMI) 37.0-37.9, adult: Secondary | ICD-10-CM

## 2013-11-14 DIAGNOSIS — E669 Obesity, unspecified: Secondary | ICD-10-CM | POA: Diagnosis present

## 2013-11-14 DIAGNOSIS — O411090 Infection of amniotic sac and membranes, unspecified, unspecified trimester, not applicable or unspecified: Secondary | ICD-10-CM | POA: Diagnosis present

## 2013-11-14 DIAGNOSIS — Z8249 Family history of ischemic heart disease and other diseases of the circulatory system: Secondary | ICD-10-CM

## 2013-11-14 DIAGNOSIS — Z833 Family history of diabetes mellitus: Secondary | ICD-10-CM | POA: Diagnosis not present

## 2013-11-14 DIAGNOSIS — O479 False labor, unspecified: Secondary | ICD-10-CM | POA: Diagnosis present

## 2013-11-14 LAB — CBC
HCT: 36.6 % (ref 36.0–46.0)
HEMOGLOBIN: 12.3 g/dL (ref 12.0–15.0)
MCH: 27.7 pg (ref 26.0–34.0)
MCHC: 33.6 g/dL (ref 30.0–36.0)
MCV: 82.4 fL (ref 78.0–100.0)
Platelets: 227 10*3/uL (ref 150–400)
RBC: 4.44 MIL/uL (ref 3.87–5.11)
RDW: 13.8 % (ref 11.5–15.5)
WBC: 23.2 10*3/uL — ABNORMAL HIGH (ref 4.0–10.5)

## 2013-11-14 LAB — RPR

## 2013-11-14 LAB — TYPE AND SCREEN
ABO/RH(D): A POS
Antibody Screen: NEGATIVE

## 2013-11-14 LAB — ABO/RH: ABO/RH(D): A POS

## 2013-11-14 MED ORDER — OXYCODONE-ACETAMINOPHEN 5-325 MG PO TABS: 1.0000 | ORAL_TABLET | ORAL | Status: DC | PRN

## 2013-11-14 MED ORDER — LACTATED RINGERS IV SOLN: 500.0000 mL | INTRAVENOUS | Status: DC | PRN

## 2013-11-14 MED ORDER — FENTANYL 2.5 MCG/ML BUPIVACAINE 1/10 % EPIDURAL INFUSION (WH - ANES)
14.0000 mL/h | INTRAMUSCULAR | Status: DC | PRN
  Administered 2013-11-14 (×3): 14 mL/h via EPIDURAL
  Filled 2013-11-14 (×3): qty 125

## 2013-11-14 MED ORDER — GENTAMICIN SULFATE 40 MG/ML IJ SOLN
190.0000 mg | Freq: Three times a day (TID) | INTRAVENOUS | Status: DC
  Filled 2013-11-14 (×2): qty 4.75

## 2013-11-14 MED ORDER — PHENYLEPHRINE 40 MCG/ML (10ML) SYRINGE FOR IV PUSH (FOR BLOOD PRESSURE SUPPORT): 80.0000 ug | PREFILLED_SYRINGE | INTRAVENOUS | Status: DC | PRN

## 2013-11-14 MED ORDER — GENTAMICIN SULFATE 40 MG/ML IJ SOLN
200.0000 mg | Freq: Once | INTRAVENOUS | Status: AC
  Administered 2013-11-14: 200 mg via INTRAVENOUS
  Filled 2013-11-14: qty 5

## 2013-11-14 MED ORDER — LIDOCAINE HCL (PF) 1 % IJ SOLN
30.0000 mL | INTRAMUSCULAR | Status: DC | PRN
  Filled 2013-11-14: qty 30

## 2013-11-14 MED ORDER — EPHEDRINE 5 MG/ML INJ: 10.0000 mg | INTRAVENOUS | Status: DC | PRN

## 2013-11-14 MED ORDER — OXYTOCIN BOLUS FROM INFUSION: 500.0000 mL | INTRAVENOUS | Status: DC

## 2013-11-14 MED ORDER — FENTANYL CITRATE 0.05 MG/ML IJ SOLN
100.0000 ug | INTRAMUSCULAR | Status: DC | PRN
  Administered 2013-11-14 (×2): 100 ug via INTRAVENOUS
  Filled 2013-11-14 (×2): qty 2

## 2013-11-14 MED ORDER — LIDOCAINE HCL (PF) 1 % IJ SOLN
INTRAMUSCULAR | Status: DC | PRN
  Administered 2013-11-14: 10 mL

## 2013-11-14 MED ORDER — OXYTOCIN 40 UNITS IN LACTATED RINGERS INFUSION - SIMPLE MED: 62.5000 mL/h | INTRAVENOUS | Status: DC

## 2013-11-14 MED ORDER — ONDANSETRON HCL 4 MG/2ML IJ SOLN: 4.0000 mg | Freq: Four times a day (QID) | INTRAMUSCULAR | Status: DC | PRN

## 2013-11-14 MED ORDER — FENTANYL 2.5 MCG/ML BUPIVACAINE 1/10 % EPIDURAL INFUSION (WH - ANES): 14.0000 mL/h | INTRAMUSCULAR | Status: DC | PRN

## 2013-11-14 MED ORDER — OXYTOCIN 40 UNITS IN LACTATED RINGERS INFUSION - SIMPLE MED
1.0000 m[IU]/min | INTRAVENOUS | Status: DC
  Administered 2013-11-14: 2 m[IU]/min via INTRAVENOUS
  Filled 2013-11-14: qty 1000

## 2013-11-14 MED ORDER — LACTATED RINGERS IV SOLN
INTRAVENOUS | Status: DC
  Administered 2013-11-14 (×3): via INTRAVENOUS

## 2013-11-14 MED ORDER — TERBUTALINE SULFATE 1 MG/ML IJ SOLN: 0.2500 mg | Freq: Once | INTRAMUSCULAR | Status: AC | PRN

## 2013-11-14 MED ORDER — ACETAMINOPHEN 325 MG PO TABS
650.0000 mg | ORAL_TABLET | ORAL | Status: DC | PRN
  Administered 2013-11-14: 650 mg via ORAL
  Filled 2013-11-14: qty 2

## 2013-11-14 MED ORDER — MORPHINE SULFATE 4 MG/ML IJ SOLN
4.0000 mg | Freq: Once | INTRAMUSCULAR | Status: AC
  Administered 2013-11-14: 4 mg via INTRAMUSCULAR
  Filled 2013-11-14: qty 1

## 2013-11-14 MED ORDER — SODIUM CHLORIDE 0.9 % IV SOLN
2.0000 g | Freq: Four times a day (QID) | INTRAVENOUS | Status: DC
  Administered 2013-11-14: 2 g via INTRAVENOUS
  Filled 2013-11-14 (×3): qty 2000

## 2013-11-14 MED ORDER — OXYCODONE-ACETAMINOPHEN 5-325 MG PO TABS: 2.0000 | ORAL_TABLET | ORAL | Status: DC | PRN

## 2013-11-14 MED ORDER — DIPHENHYDRAMINE HCL 50 MG/ML IJ SOLN: 12.5000 mg | INTRAMUSCULAR | Status: DC | PRN

## 2013-11-14 MED ORDER — CITRIC ACID-SODIUM CITRATE 334-500 MG/5ML PO SOLN: 30.0000 mL | ORAL | Status: DC | PRN

## 2013-11-14 MED ORDER — LACTATED RINGERS IV SOLN
500.0000 mL | Freq: Once | INTRAVENOUS | Status: AC
  Administered 2013-11-14: 500 mL via INTRAVENOUS

## 2013-11-14 NOTE — Anesthesia Preprocedure Evaluation (Addendum)
Anesthesia Evaluation  Patient identified by MRN, date of birth, ID band Patient awake    Reviewed: Allergy & Precautions, H&P , Patient's Chart, lab work & pertinent test results  Airway Mallampati: II TM Distance: >3 FB Neck ROM: full    Dental  (+) Teeth Intact   Pulmonary  breath sounds clear to auscultation        Cardiovascular Rhythm:regular Rate:Normal     Neuro/Psych    GI/Hepatic GERD-  Medicated,  Endo/Other  Morbid obesity  Renal/GU      Musculoskeletal   Abdominal   Peds  Hematology   Anesthesia Other Findings       Reproductive/Obstetrics (+) Pregnancy                          Anesthesia Physical Anesthesia Plan  ASA: III  Anesthesia Plan: Epidural   Post-op Pain Management:    Induction:   Airway Management Planned:   Additional Equipment:   Intra-op Plan:   Post-operative Plan:   Informed Consent: I have reviewed the patients History and Physical, chart, labs and discussed the procedure including the risks, benefits and alternatives for the proposed anesthesia with the patient or authorized representative who has indicated his/her understanding and acceptance.   Dental Advisory Given  Plan Discussed with:   Anesthesia Plan Comments: (Labs checked- platelets confirmed with RN in room. Fetal heart tracing, per RN, reported to be stable enough for sitting procedure. Discussed epidural, and patient consents to the procedure:  included risk of possible headache,backache, failed block, allergic reaction, and nerve injury. This patient was asked if she had any questions or concerns before the procedure started.)        Anesthesia Quick Evaluation

## 2013-11-14 NOTE — Anesthesia Procedure Notes (Signed)

## 2013-11-14 NOTE — Progress Notes (Signed)
ANTIBIOTIC CONSULT NOTE - INITIAL  Pharmacy Consult for Gentamicin Indication: Chorioamnionitis  No Known Allergies  Patient Measurements: Height:  (167.6 cm) Weight: 233 lb (105.688 kg) IBW/kg (Calculated) : 59.3 Adjusted Body Weight: 73kg  Vital Signs: Temp: 100.7 F (38.2 C) (09/11 2317) Temp src: Axillary (09/11 2317) BP: 140/80 mmHg (09/11 2331) Pulse Rate: 97 (09/11 2331) Intake/Output from previous day:   Intake/Output from this shift: Total I/O In: -  Out: 450 [Urine:450]  Labs:  Recent Labs  11/14/13 0410  WBC 23.2*  HGB 12.3  PLT 227   CrCl is unknown because no creatinine reading has been taken. No results found for this basename: VANCOTROUGH, Leodis Binet, VANCORANDOM, GENTTROUGH, GENTPEAK, GENTRANDOM, TOBRATROUGH, TOBRAPEAK, TOBRARND, AMIKACINPEAK, AMIKACINTROU, AMIKACIN,  in the last 72 hours   Microbiology: Recent Results (from the past 720 hour(s))  GC/CHLAMYDIA PROBE AMP     Status: None   Collection Time    10/23/13  4:39 PM      Result Value Ref Range Status   CT Probe RNA NEGATIVE   Final   GC Probe RNA NEGATIVE   Final   Comment:                                                                                             **Normal Reference Range: Negative**                 Assay performed using the Gen-Probe APTIMA COMBO2 (R) Assay.           Acceptable specimen types for this assay include APTIMA Swabs (Unisex,     endocervical, urethral, or vaginal), first void urine, and ThinPrep     liquid based cytology samples.  STREP B DNA PROBE     Status: None   Collection Time    10/23/13  4:39 PM      Result Value Ref Range Status   GBSP NOT DETECTED   Final    Medical History: Past Medical History  Diagnosis Date  . Medical history non-contributory   . Supervision of normal pregnancy in first trimester 04/18/2013    Medications:  Ampicillin 2 GM IV every 6 horus  Assessment: 23 yo G1P0 at [redacted]w[redacted]d in labor; now with increased  temperature and presumed chorioamnionitis.  Goal of Therapy:  Gentamicin peaks 6-8 mcg/ml; troughs <1 mcg/ml  Plan:  Gentamicin 200 mg IV loading dose; then 190 mg IV every 8 hours. Check serum creatinine if gentamicin is continued. Serum gentamicin levels as indicated  Arelia Sneddon 11/14/2013,11:32 PM

## 2013-11-14 NOTE — MAU Note (Signed)
Pt reports painful uc's q 2-4 min

## 2013-11-14 NOTE — Progress Notes (Signed)
Alexandra Oneal is a 23 y.o. G1P0 at [redacted]w[redacted]d   Subjective: Comfortable with epidural  Objective: BP 138/76  Pulse 99  Temp(Src) 98.1 F (36.7 C) (Oral)  Resp 18  Ht  (1.676 m)  Wt 105.688 kg (233 lb)  BMI 37.63 kg/m2  SpO2 93%      FHT:  FHR: 140s bpm, variability: moderate,  accelerations:  Present,  decelerations:  Absent; occ mi variables UC:   regular, every 2-4 minutes with Pit @ 26mu/min SVE:   Dilation: 6.5 Effacement (%): 100 Station: 0 Exam by:: S Nix RN- AROM for mod MSF  Labs: Lab Results  Component Value Date   WBC 23.2* 11/14/2013   HGB 12.3 11/14/2013   HCT 36.6 11/14/2013   MCV 82.4 11/14/2013   PLT 227 11/14/2013    Assessment / Plan: IUP @ 40.0wks Active labor  Will continue to increase Pitocin to keep ctx regular Examine cx in 2hrs or sooner prn  SHAW, KIMBERLY CNM 11/14/2013, 1:15 PM

## 2013-11-14 NOTE — H&P (Signed)
Alexandra Oneal is a 23 y.o. G1P0 at GA 40.0 by 78wk0d sono  female presenting for onset of contractions. Reports onset yesterday evening with worsening since. Reports good fetal movement. No reports of leakage of fluid or vaginal bleeding. Desires epidural for pain.  History OB History   Grav Para Term Preterm Abortions TAB SAB Ect Mult Living   1              Past Medical History  Diagnosis Date  . Medical history non-contributory   . Supervision of normal pregnancy in first trimester 04/18/2013   Past Surgical History  Procedure Laterality Date  . No past surgeries     Family History: family history includes Alcohol abuse in her paternal grandfather; Cancer in her maternal grandmother; Diabetes in her maternal grandmother and mother; Heart disease in her maternal grandfather and mother; Hypertension in her mother; Mental illness in her sister and sister. Social History:  reports that she has never smoked. She has never used smokeless tobacco. She reports that she does not drink alcohol or use illicit drugs.  Review of Systems  Constitutional: Negative for fever and chills.  Gastrointestinal: Positive for vomiting and abdominal pain.     Maternal Exam:  Uterine Assessment: Contraction strength is moderate.  Contraction frequency is regular.   Abdomen: Fetal presentation: vertex  Introitus: Normal vulva. Normal vagina.  Cervix: Cervix evaluated by digital exam.   4/90/-1  Fetal Exam Fetal Monitor Review: Baseline rate: 150.  Variability: moderate (6-25 bpm).   Pattern: accelerations present and no decelerations.    Fetal State Assessment: Category I - tracings are normal.     Physical Exam  Constitutional: She appears well-developed. She appears distressed.  HENT:  Head: Normocephalic.  Eyes: Conjunctivae and EOM are normal.  Neck: Normal range of motion.  Cardiovascular: Normal rate, regular rhythm and normal heart sounds.   Respiratory: Effort normal and breath  sounds normal. No respiratory distress.  GI: Soft. Bowel sounds are normal. There is no tenderness.  Musculoskeletal: She exhibits no edema.  Skin: Skin is warm and dry.      [facility]  FOB Jana Half first   Pap   GC/CT Initial:                36+wks:  Genetic Screen NT/IT: negative  CF screen negative  Anatomic [place] normal  Flu vaccine Got in September at work  Tdap Recommended ~ 28wks  Glucose Screen  2 hr   90/152/119  GBS negative  Feed Preference breast  Contraception POPs  Circumcision As outpatient  Childbirth Classes   Pediatrician    Prenatal labs: ABO, Rh: A/POS/-- (02/13 1240) Antibody: NEG (06/19 0912) Rubella: 1.32 (02/13 1240) RPR: NON REAC (06/19 0912)  HBsAg: NEGATIVE (02/13 1240)  HIV: NONREACTIVE (06/19 0912)  GBS: NOT DETECTED (08/20 1639)   Assessment/Plan: Alexandra Oneal is a 23 y.o. G1P0 at GA 40.0 by 91wk0d sono  female presenting for onset of contractions.  1. IUP at 40.0. -Pt in early active labor. -Will admit for expectant management.  2. FWB. Category I strip.  3. MOC: POPs  4. MOF: breast  5. Circ: as outpatient.  Pt was discussed with Jacklyn Shell, CNM.  Tomma Rakers , MD, PGY3. 11/14/2013, 3:50 AM    I have seen and examined this patient and agree the above assessment. CRESENZO-DISHMAN,Ransom Nickson 11/14/2013 4:45 AM

## 2013-11-14 NOTE — Progress Notes (Signed)
Alexandra Oneal is a 23 y.o. G1P0 at [redacted]w[redacted]d by 9wk  ultrasound admitted for active labor  Subjective:   Objective: BP 120/72  Pulse 95  Temp(Src) 98.7 F (37.1 C) (Oral)  Resp 18  Ht  (1.676 m)  Wt 105.688 kg (233 lb)  BMI 37.63 kg/m2  SpO2 93%   Total I/O In: -  Out: 450 [Urine:450]  FHT:  FHR: 150 bpm, variability: moderate,  accelerations:  Present,  decelerations:  Absent UC:   regular, every 3 minutes SVE:   Dilation: Lip/rim Effacement (%): 100 Station: +1 Exam by:: Karie Mainland MD IUPC placed Labs: Lab Results  Component Value Date   WBC 23.2* 11/14/2013   HGB 12.3 11/14/2013   HCT 36.6 11/14/2013   MCV 82.4 11/14/2013   PLT 227 11/14/2013    Assessment / Plan: Augmentation of labor, progressing well  Labor: Progressing normally Preeclampsia:  no signs or symptoms of toxicity Fetal Wellbeing:  Category I Pain Control:  Epidural I/D:  n/a Anticipated MOD:  NSVD  Tomma Rakers, MD, PGY3 11/14/2013, 9:36 PM

## 2013-11-14 NOTE — Progress Notes (Signed)
Pt to walk and recheck in one hour

## 2013-11-15 ENCOUNTER — Encounter (HOSPITAL_COMMUNITY): Payer: Self-pay | Admitting: *Deleted

## 2013-11-15 DIAGNOSIS — O411090 Infection of amniotic sac and membranes, unspecified, unspecified trimester, not applicable or unspecified: Secondary | ICD-10-CM

## 2013-11-15 DIAGNOSIS — Z6837 Body mass index (BMI) 37.0-37.9, adult: Secondary | ICD-10-CM

## 2013-11-15 LAB — CBC
HEMATOCRIT: 31.8 % — AB (ref 36.0–46.0)
Hemoglobin: 10.7 g/dL — ABNORMAL LOW (ref 12.0–15.0)
MCH: 27.9 pg (ref 26.0–34.0)
MCHC: 33.6 g/dL (ref 30.0–36.0)
MCV: 83 fL (ref 78.0–100.0)
Platelets: 204 10*3/uL (ref 150–400)
RBC: 3.83 MIL/uL — AB (ref 3.87–5.11)
RDW: 14.3 % (ref 11.5–15.5)
WBC: 34.6 10*3/uL — ABNORMAL HIGH (ref 4.0–10.5)

## 2013-11-15 MED ORDER — SENNOSIDES-DOCUSATE SODIUM 8.6-50 MG PO TABS
2.0000 | ORAL_TABLET | ORAL | Status: DC
  Administered 2013-11-15 – 2013-11-17 (×2): 2 via ORAL
  Filled 2013-11-15 (×2): qty 2

## 2013-11-15 MED ORDER — ONDANSETRON HCL 4 MG PO TABS: 4.0000 mg | ORAL_TABLET | ORAL | Status: DC | PRN

## 2013-11-15 MED ORDER — DIBUCAINE 1 % RE OINT
1.0000 "application " | TOPICAL_OINTMENT | RECTAL | Status: DC | PRN
  Filled 2013-11-15: qty 28

## 2013-11-15 MED ORDER — SIMETHICONE 80 MG PO CHEW: 80.0000 mg | CHEWABLE_TABLET | ORAL | Status: DC | PRN

## 2013-11-15 MED ORDER — PRENATAL MULTIVITAMIN CH
1.0000 | ORAL_TABLET | Freq: Every day | ORAL | Status: DC
  Administered 2013-11-15 – 2013-11-17 (×3): 1 via ORAL
  Filled 2013-11-15 (×3): qty 1

## 2013-11-15 MED ORDER — LANOLIN HYDROUS EX OINT: TOPICAL_OINTMENT | CUTANEOUS | Status: DC | PRN

## 2013-11-15 MED ORDER — ZOLPIDEM TARTRATE 5 MG PO TABS: 5.0000 mg | ORAL_TABLET | Freq: Every evening | ORAL | Status: DC | PRN

## 2013-11-15 MED ORDER — TETANUS-DIPHTH-ACELL PERTUSSIS 5-2.5-18.5 LF-MCG/0.5 IM SUSP
0.5000 mL | Freq: Once | INTRAMUSCULAR | Status: AC
  Administered 2013-11-15: 0.5 mL via INTRAMUSCULAR

## 2013-11-15 MED ORDER — DIPHENHYDRAMINE HCL 25 MG PO CAPS: 25.0000 mg | ORAL_CAPSULE | Freq: Four times a day (QID) | ORAL | Status: DC | PRN

## 2013-11-15 MED ORDER — OXYCODONE-ACETAMINOPHEN 5-325 MG PO TABS
1.0000 | ORAL_TABLET | ORAL | Status: DC | PRN
  Administered 2013-11-15 (×2): 1 via ORAL
  Filled 2013-11-15 (×2): qty 1

## 2013-11-15 MED ORDER — ONDANSETRON HCL 4 MG/2ML IJ SOLN: 4.0000 mg | INTRAMUSCULAR | Status: DC | PRN

## 2013-11-15 MED ORDER — BENZOCAINE-MENTHOL 20-0.5 % EX AERO
1.0000 "application " | INHALATION_SPRAY | CUTANEOUS | Status: DC | PRN
  Filled 2013-11-15 (×2): qty 56

## 2013-11-15 MED ORDER — WITCH HAZEL-GLYCERIN EX PADS: 1.0000 "application " | MEDICATED_PAD | CUTANEOUS | Status: DC | PRN

## 2013-11-15 MED ORDER — IBUPROFEN 600 MG PO TABS
600.0000 mg | ORAL_TABLET | Freq: Four times a day (QID) | ORAL | Status: DC
  Administered 2013-11-15 – 2013-11-17 (×10): 600 mg via ORAL
  Filled 2013-11-15 (×10): qty 1

## 2013-11-15 NOTE — Lactation Note (Signed)
This note was copied from the chart of Alexandra Oneal. Lactation Consultation Note  P1, Mother placed baby in football hold.  With compression instruction, baby latched.  Sucks and some swallows observed. Reviewed how to [place] baby and cluster feeding. When unlatched mother's nipple was flattened.  Reviewed how to achieve a deeper latch and massage to keep him active. Mother repositioned him on other breast with improved latching. Mom encouraged to feed baby 8-12 times/24 hours and with feeding cues.  Mom made aware of O/P services, breastfeeding support groups, community resources, and our phone # for post-discharge questions.   Patient Name: Alexandra Oneal ZOXWR'U Date: 11/15/2013 Reason for consult: Initial assessment   Maternal Data Has patient been taught Hand Expression?: Yes Does the patient have breastfeeding experience prior to this delivery?: No  Feeding Feeding Type: Breast Fed Length of feed: 20 min  LATCH Score/Interventions Latch: Grasps breast easily, tongue down, lips flanged, rhythmical sucking.  Audible Swallowing: A few with stimulation Intervention(s): Hand expression Intervention(s): Alternate breast massage  Type of Nipple: Everted at rest and after stimulation  Comfort (Breast/Nipple): Soft / non-tender     Hold (Positioning): Assistance needed to correctly position infant at breast and maintain latch.  LATCH Score: 8  Lactation Tools Discussed/Used     Consult Status Consult Status: Follow-up Date: 11/16/13 Follow-up type: In-patient    Dahlia Byes [facility] 11/15/2013, 1:57 PM

## 2013-11-15 NOTE — Anesthesia Postprocedure Evaluation (Signed)
Anesthesia Post Note  Patient: Alexandra Oneal  Procedure(s) Performed: * No procedures listed *  Anesthesia type: Epidural  Patient location: Mother/Baby  Post pain: Pain level controlled  Post assessment: Post-op Vital signs reviewed  Last Vitals:  Filed Vitals:   11/15/13 0636  BP: 134/77  Pulse: 92  Temp: 37.1 C  Resp: 18    Post vital signs: Reviewed  Level of consciousness:alert  Complications: No apparent anesthesia complications

## 2013-11-16 NOTE — Progress Notes (Signed)
Post Partum Day 1 Subjective: no complaints, up ad lib, voiding, tolerating PO and somewhat frustrated with breastfeeding and fatigue  Objective: Blood pressure 112/68, pulse 86, temperature 97.6 F (36.4 C), temperature source Oral, resp. rate 17, height  (1.676 m), weight 233 lb (105.688 kg), SpO2 99.00%, unknown if currently breastfeeding.  Physical Exam:  General: alert, cooperative and no distress Lochia: appropriate Uterine Fundus: firm Incision: healing well DVT Evaluation: No evidence of DVT seen on physical exam.   Recent Labs  11/14/13 0410 11/15/13 0620  HGB 12.3 10.7*  HCT 36.6 31.8*    Assessment/Plan: Plan for discharge tomorrow, Breastfeeding and Lactation consult   LOS: 2 days   [facility] 11/16/2013, 7:02 AM

## 2013-11-16 NOTE — Lactation Note (Signed)
This note was copied from the chart of Alexandra Victorya Hillman. Lactation Consultation Note  Patient Name: Alexandra Oneal ZOXWR'U Date: 11/16/2013 Reason for consult: Follow-up assessment  Baby 42 hours of life. Mom reports that she is concerned that baby is getting enough. Mom states that she is seeing colostrum, and hearing baby swallow while eating. Discussed with mom that her [facility] RN Debbe Bales, that worked with her earlier today, reported to [facility] that baby was nursing great and mom doing very well. Mom concerned baby has not had a stool. Discussed with patient's [facility] RN Joni Reining, and notes reflect pediatrician aware of no stool. Enc mom to offer lots of STS, nurse with cues, and at least 8-12 times/24 hours.  Maternal Data    Feeding Feeding Type:  (Just finished nursing, baby sleeping.)  LATCH Score/Interventions                      Lactation Tools Discussed/Used     Consult Status Consult Status: PRN    Geralynn Ochs 11/16/2013, 9:46 PM

## 2013-11-17 MED ORDER — IBUPROFEN 600 MG PO TABS: 600.0000 mg | ORAL_TABLET | Freq: Four times a day (QID) | ORAL | Status: DC

## 2013-11-17 MED ORDER — NORETHINDRONE 0.35 MG PO TABS: 1.0000 | ORAL_TABLET | Freq: Every day | ORAL | Status: DC

## 2013-11-17 MED ORDER — OXYCODONE-ACETAMINOPHEN 5-325 MG PO TABS: 1.0000 | ORAL_TABLET | ORAL | Status: DC | PRN

## 2013-11-17 NOTE — Progress Notes (Signed)
Ur chart review completed.  

## 2013-11-17 NOTE — Discharge Summary (Signed)
Obstetric Discharge Summary Reason for Admission: onset of labor Prenatal Procedures: ultrasound Intrapartum Procedures: spontaneous vaginal delivery Postpartum Procedures: none Complications-Operative and Postpartum: none Hemoglobin  Date Value Ref Range Status  11/15/2013 10.7* 12.0 - 15.0 g/dL Final     HCT  Date Value Ref Range Status  11/15/2013 31.8* 36.0 - 46.0 % Final    Physical Exam:  General: alert, cooperative, appears stated age and no distress Lochia: appropriate Uterine Fundus: firm Incision: n/a DVT Evaluation: No evidence of DVT seen on physical exam. Negative Homan's sign. No cords or calf tenderness. No significant calf/ankle edema.  Discharge Diagnoses: Term Pregnancy-delivered  Discharge Information: Date: 11/17/2013 Activity: pelvic rest Diet: routine Medications: PNV, Ibuprofen and Percocet Condition: stable and improved Instructions: refer to practice specific booklet Discharge to: home   Newborn Data: Live born female  Birth Weight: 8 lb 0.8 oz (3650 g) APGAR: 3, 8  Home with mother.  Alexandra Oneal 11/17/2013, 7:43 AM

## 2013-11-21 ENCOUNTER — Inpatient Hospital Stay (HOSPITAL_COMMUNITY): Admission: RE | Admit: 2013-11-21 | Payer: Medicaid Other | Source: Ambulatory Visit

## 2013-12-09 ENCOUNTER — Telehealth: Payer: Self-pay | Admitting: *Deleted

## 2013-12-09 NOTE — Telephone Encounter (Signed)
[facility] pt c/o panic attack, heart pounds, dizziness, shortness of breath, chest pains last week but not today. Pt vitals per Clydie BraunKaren Little B/P 120/76, P 72, R 20, Edinburgh Depression Scale score 6. Call transferred to front staff for an appt to be made for pt to be seen and evaluated.

## 2013-12-10 ENCOUNTER — Ambulatory Visit (INDEPENDENT_AMBULATORY_CARE_PROVIDER_SITE_OTHER): Payer: Medicaid Other | Admitting: Women's Health

## 2013-12-10 ENCOUNTER — Encounter: Payer: Self-pay | Admitting: Women's Health

## 2013-12-10 VITALS — BP 128/74 | Ht 67.0 in | Wt 210.0 lb

## 2013-12-10 DIAGNOSIS — M545 Low back pain, unspecified: Secondary | ICD-10-CM | POA: Insufficient documentation

## 2013-12-10 DIAGNOSIS — R519 Headache, unspecified: Secondary | ICD-10-CM | POA: Insufficient documentation

## 2013-12-10 DIAGNOSIS — R51 Headache: Secondary | ICD-10-CM

## 2013-12-10 MED ORDER — BUTALBITAL-APAP-CAFFEINE 50-325-40 MG PO TABS: 1.0000 | ORAL_TABLET | Freq: Four times a day (QID) | ORAL | Status: DC | PRN

## 2013-12-10 NOTE — Progress Notes (Signed)
Patient ID: Alexandra Oneal, female   DOB: November 08, 1990, 23 y.o.   MRN: [id number]   [facility]  Patient name: Alexandra Oneal MRN [id number]  Date of birth: November 08, 1990  CC & HPI:  Alexandra Oneal is a 23 y.o. Caucasian 411P1001 female 3 1/2wks s/p SVB presenting today for report of anxiety attacks, headaches, and LBP. She reports anxiety attacks as ~1/wk, heart starts racing, she gets a little dizzy and starts seeing spots in peripheral vision, happened during pregnancy and pre-pregnancy as well, no change since birth of baby. Holding breath and bearing down as previously directed helps sometimes. Does not feel anxious prior to this happening. Denies depression, SI/HI/II. Does feel overwhelmed at times as she has no help caring for the baby- her boyfriend works out of town during week and only comes home on weekends, and she has no family here. Admits she is not eating like she should. Is sleeping. Finds joy in her baby.  Headaches daily to qod, ibuprofen or apap doesn't help. No change w/ laying down. LBP since delivery, did have epidural. Bottlefeeding. Says homehealth nurse talked her into making appt.   Pertinent History Reviewed:  Medical & Surgical Hx:   Past Medical History  Diagnosis Date  . Medical history non-contributory   . Supervision of normal pregnancy in first trimester 04/18/2013   Past Surgical History  Procedure Laterality Date  . No past surgeries     Medications: Reviewed & Updated - see associated section Social History: Reviewed -  reports that she has never smoked. She has never used smokeless tobacco.  Objective Findings:  Vitals: BP 128/74  Ht 5\' 7"  (1.702 m)  Wt 210 lb (95.255 kg)  BMI 32.88 kg/m2  Breastfeeding? No  Physical Examination: General appearance - alert, well appearing, and in no distress  No results found for this or any previous visit (from the past 24 hour(s)).   Assessment & Plan:  A:   3 1/2wks s/p  SVB  Bottlefeeding  Headaches  LBP  ? Anxiety attacks  P:  Rx fioricet for headaches, increase fluids, eat small frequent meals/snacks  LBP: heating pad no longer than 6520min/time, warm shower, ibuprofen prn, good posture  ?Anxiety attacks: continue valsalva maneuver, sit/lay down get snack/drink til passes, slow deep breaths  F/U as scheduled for pp visit  Call if any sx worsening, or not improving   Marge DuncansBooker, Kimberly Randall CNM, [facility]NP-BC 12/10/2013 12:56 PM

## 2013-12-10 NOTE — Patient Instructions (Signed)
Increase water- enough so that urine is clear Eat better! When attack happens, get snack/drink, sit/lay down til passes, can hold breath and bear down to try to slow hr down  Back: heating pad, no longer than at a time Warm showers Ibuprofen

## 2013-12-25 ENCOUNTER — Encounter: Payer: Self-pay | Admitting: Advanced Practice Midwife

## 2013-12-25 ENCOUNTER — Ambulatory Visit (INDEPENDENT_AMBULATORY_CARE_PROVIDER_SITE_OTHER): Payer: Medicaid Other | Admitting: Advanced Practice Midwife

## 2013-12-25 DIAGNOSIS — F418 Other specified anxiety disorders: Secondary | ICD-10-CM

## 2013-12-25 DIAGNOSIS — O99345 Other mental disorders complicating the puerperium: Secondary | ICD-10-CM

## 2013-12-25 MED ORDER — NORGESTIMATE-ETH ESTRADIOL 0.25-35 MG-MCG PO TABS: 1.0000 | ORAL_TABLET | Freq: Every day | ORAL | Status: DC

## 2013-12-25 NOTE — Progress Notes (Signed)
  Alexandra Oneal is a 23 y.o. who presents for a postpartum visit. She is 6 weeks postpartum following a spontaneous vaginal delivery. I have fully reviewed the prenatal and intrapartum course. The delivery was at 40.1 gestational weeks. She had spontaneous labor, developed chorioamnionitis, and had a clavicle fracture. Anesthesia: epidural. Postpartum course has been complicated by anxiety/panic attacks.  Saw K. Booker, CNM, about 2 weeks ago, declined meds/counseling. Today, her Alexandra Oneal score is 7518 and she is interested in couseling, possibly meds. Denies SI/HI.  Baby's course has been uneventful. Baby is feeding by bottle. Bleeding: no bleeding. Bowel function is normal. Bladder function is normal. Patient is sexually active. Contraception method is POP's now, but will change to COC's.     Review of Systems   Constitutional: Negative for fever and chills Eyes: Negative for visual disturbances Respiratory: Negative for shortness of breath, dyspnea Cardiovascular: Negative for chest pain or palpitations  Gastrointestinal: Negative for vomiting, diarrhea and constipation Genitourinary: Negative for dysuria and urgency Musculoskeletal: Negative for back pain, joint pain, myalgias  Neurological: Negative for dizziness and headaches   Objective:     Filed Vitals:   12/25/13 0944  BP: 110/76   General:  alert, cooperative and no distress   Breasts:  negative  Lungs: clear to auscultation bilaterally  Heart:  regular rate and rhythm  Abdomen: Soft, nontender   Vulva:  normal  Vagina: normal vagina  Cervix:  closed  Corpus: Well involuted     Rectal Exam: no hemorrhoids        Assessment:    normal postpartum exam. Postpartum depression/anxiety  Plan:    1. Contraception: COC's Sprintec 2.Note to go back to work given for 01/05/14 3. Referral to [facility] made  4..Follow up  as needed.

## 2014-01-05 ENCOUNTER — Encounter: Payer: Self-pay | Admitting: Advanced Practice Midwife

## 2016-09-15 DIAGNOSIS — Z01419 Encounter for gynecological examination (general) (routine) without abnormal findings: Secondary | ICD-10-CM | POA: Diagnosis not present

## 2016-09-20 DIAGNOSIS — L03116 Cellulitis of left lower limb: Secondary | ICD-10-CM | POA: Diagnosis not present

## 2017-01-30 DIAGNOSIS — J019 Acute sinusitis, unspecified: Secondary | ICD-10-CM | POA: Diagnosis not present

## 2017-05-15 ENCOUNTER — Encounter: Payer: Self-pay | Admitting: Obstetrics & Gynecology

## 2017-05-31 ENCOUNTER — Encounter (INDEPENDENT_AMBULATORY_CARE_PROVIDER_SITE_OTHER): Payer: Self-pay

## 2017-05-31 ENCOUNTER — Ambulatory Visit (INDEPENDENT_AMBULATORY_CARE_PROVIDER_SITE_OTHER): Payer: 59 | Admitting: Advanced Practice Midwife

## 2017-05-31 ENCOUNTER — Other Ambulatory Visit: Payer: Self-pay

## 2017-05-31 ENCOUNTER — Encounter: Payer: Self-pay | Admitting: Advanced Practice Midwife

## 2017-05-31 VITALS — BP 118/80 | HR 73 | Ht 66.0 in | Wt 198.0 lb

## 2017-05-31 DIAGNOSIS — Z978 Presence of other specified devices: Secondary | ICD-10-CM

## 2017-05-31 DIAGNOSIS — N921 Excessive and frequent menstruation with irregular cycle: Secondary | ICD-10-CM

## 2017-05-31 DIAGNOSIS — Z975 Presence of (intrauterine) contraceptive device: Principal | ICD-10-CM | POA: Insufficient documentation

## 2017-05-31 NOTE — Progress Notes (Signed)
[facility] Visit  Patient name: Alexandra Oneal MRN [id number]  Date of birth: [birth year]  CC & HPI:  Alexandra Oneal is a 27 y.o. Caucasian female presenting today for bleeding for 4 months.  Got Nexplanon a year ago.  Varies from heavy to light.    Pertinent History Reviewed:  Medical & Surgical Hx:   Past Medical History:  Diagnosis Date  . Medical history non-contributory   . Supervision of normal pregnancy in first trimester 04/18/2013   Past Surgical History:  Procedure Laterality Date  . NO PAST SURGERIES     Family History  Problem Relation Age of Onset  . Heart disease Mother        heart attack  . Hypertension Mother   . Diabetes Mother   . Cancer Maternal Grandmother        breast  . Diabetes Maternal Grandmother   . Heart disease Maternal Grandfather        heart attack  . Alcohol abuse Paternal Grandfather   . Mental illness Sister        severe bipolar  . Mental illness Sister     Current Outpatient Medications:  .  ibuprofen (ADVIL,MOTRIN) 600 MG tablet, Take 1 tablet (600 mg total) by mouth every 6 (six) hours., Disp: 30 tablet, Rfl: 0 .  Multiple Vitamin (MULTIVITAMIN) capsule, Take 1 capsule by mouth daily., Disp: , Rfl:  .  omeprazole (PRILOSEC) 20 MG capsule, Take 1 capsule (20 mg total) by mouth daily. 1 tablet a day, Disp: 30 capsule, Rfl: 6 Social History: Reviewed -  reports that she has never smoked. She has never used smokeless tobacco.  Review of Systems:   Constitutional: Negative for fever and chills Eyes: Negative for visual disturbances Respiratory: Negative for shortness of breath, dyspnea Cardiovascular: Negative for chest pain or palpitations  Gastrointestinal: Negative for vomiting, diarrhea and constipation; no abdominal pain Genitourinary: Negative for dysuria and urgency, vaginal irritation or itching Musculoskeletal: Negative for back pain, joint pain, myalgias  Neurological: Negative for dizziness and  headaches    Objective Findings:    Physical Examination: General appearance - well appearing, and in no distress Mental status - alert, oriented to person, place, and time Chest:  Normal respiratory effort Heart - normal rate and regular rhythm Abdomen:  Soft, nontender Pelvic: SSE dark menstrual blood, cx non friable Musculoskeletal:  Normal range of motion without pain Extremities:  No edema    No results found for this or any previous visit (from the past 24 hour(s)).    Assessment & Plan:  A:   BTB on Nexplanon P:  Megace.  If it doesn't work, pt just wants to remove it and go on COCs   Return for If you have any problems.  Jacklyn ShellFrances Cresenzo-Dishmon CNM 05/31/2017 1:56 PM

## 2017-06-01 ENCOUNTER — Other Ambulatory Visit: Payer: Self-pay | Admitting: *Deleted

## 2017-06-01 MED ORDER — MEGESTROL ACETATE 40 MG PO TABS: ORAL_TABLET | ORAL | 6 refills | Status: DC

## 2017-11-23 DIAGNOSIS — M542 Cervicalgia: Secondary | ICD-10-CM | POA: Diagnosis not present

## 2017-11-23 DIAGNOSIS — R000 Tachycardia, unspecified: Secondary | ICD-10-CM | POA: Diagnosis not present

## 2017-11-23 DIAGNOSIS — G4489 Other headache syndrome: Secondary | ICD-10-CM | POA: Diagnosis not present

## 2018-03-11 DIAGNOSIS — J019 Acute sinusitis, unspecified: Secondary | ICD-10-CM | POA: Diagnosis not present

## 2018-03-11 DIAGNOSIS — R05 Cough: Secondary | ICD-10-CM | POA: Diagnosis not present

## 2018-03-11 DIAGNOSIS — Z6830 Body mass index (BMI) 30.0-30.9, adult: Secondary | ICD-10-CM | POA: Diagnosis not present

## 2018-04-16 ENCOUNTER — Other Ambulatory Visit: Payer: 59 | Admitting: Obstetrics & Gynecology

## 2018-07-01 ENCOUNTER — Telehealth: Payer: Self-pay | Admitting: Obstetrics & Gynecology

## 2018-07-05 ENCOUNTER — Telehealth: Payer: Self-pay | Admitting: *Deleted

## 2018-07-05 NOTE — Telephone Encounter (Signed)
Attempted to call patient regarding covid restrictions. No answer.

## 2018-07-08 ENCOUNTER — Other Ambulatory Visit: Payer: Self-pay

## 2018-07-08 ENCOUNTER — Encounter: Payer: Self-pay | Admitting: Obstetrics and Gynecology

## 2018-07-08 ENCOUNTER — Ambulatory Visit (INDEPENDENT_AMBULATORY_CARE_PROVIDER_SITE_OTHER): Payer: 59 | Admitting: Obstetrics and Gynecology

## 2018-07-08 DIAGNOSIS — Z3046 Encounter for surveillance of implantable subdermal contraceptive: Secondary | ICD-10-CM

## 2018-07-08 DIAGNOSIS — Z309 Encounter for contraceptive management, unspecified: Secondary | ICD-10-CM | POA: Insufficient documentation

## 2018-07-08 DIAGNOSIS — Z30013 Encounter for initial prescription of injectable contraceptive: Secondary | ICD-10-CM

## 2018-07-08 MED ORDER — MEDROXYPROGESTERONE ACETATE 150 MG/ML IM SUSP: 150.0000 mg | INTRAMUSCULAR | 3 refills | Status: DC

## 2018-07-08 NOTE — Progress Notes (Signed)
Patient ID: Alexandra Oneal, female   DOB: 1990-04-04, 28 y.o.   MRN: [id number]     GYNECOLOGY CLINIC PROCEDURE NOTE  Alexandra Oneal is a 28 y.o. G1P1001 here for Nexplanon removal.  No other gynecologic concerns.   Nexplanon Removal Patient identified, informed consent performed, consent signed.   Appropriate time out taken. Nexplanon site identified.  Area prepped in usual sterile fashon. One ml of 1% lidocaine was used to anesthetize the area at the distal end of the implant. A small stab incision was made right beside the implant on the distal portion.  The Nexplanon rod was grasped using hemostats and removed without difficulty.  There was minimal blood loss. There were no complications.  3 ml of 1% lidocaine was injected around the incision for post-procedure analgesia.  Steri-strips were applied over the small incision.  A pressure bandage was applied to reduce any bruising.  The patient tolerated the procedure well and was given post procedure instructions.  Patient is planning to use Depo Provera for contraception/attempt conception.   Nettie Elm, MD, FACOG Attending Obstetrician & Gynecologist Center for [facility] [address], [facility]

## 2018-07-11 ENCOUNTER — Encounter: Payer: Self-pay | Admitting: *Deleted

## 2018-07-11 ENCOUNTER — Other Ambulatory Visit: Payer: Self-pay

## 2018-07-11 ENCOUNTER — Ambulatory Visit (INDEPENDENT_AMBULATORY_CARE_PROVIDER_SITE_OTHER): Payer: 59 | Admitting: *Deleted

## 2018-07-11 DIAGNOSIS — Z30013 Encounter for initial prescription of injectable contraceptive: Secondary | ICD-10-CM

## 2018-07-11 DIAGNOSIS — Z20828 Contact with and (suspected) exposure to other viral communicable diseases: Secondary | ICD-10-CM | POA: Diagnosis not present

## 2018-07-11 DIAGNOSIS — Z6830 Body mass index (BMI) 30.0-30.9, adult: Secondary | ICD-10-CM | POA: Diagnosis not present

## 2018-07-11 DIAGNOSIS — R05 Cough: Secondary | ICD-10-CM | POA: Diagnosis not present

## 2018-07-11 MED ORDER — MEDROXYPROGESTERONE ACETATE 150 MG/ML IM SUSP
150.0000 mg | Freq: Once | INTRAMUSCULAR | Status: AC
  Administered 2018-07-11: 150 mg via INTRAMUSCULAR

## 2018-07-11 NOTE — Progress Notes (Signed)
Depo Provera 150mg IM given in left deltoid with no complications. Patient to return in 12 weeks for next injection.  

## 2018-08-29 ENCOUNTER — Emergency Department (HOSPITAL_COMMUNITY)
Admission: EM | Admit: 2018-08-29 | Discharge: 2018-08-29 | Disposition: A | Discharge status: Home / Self Care | Payer: 59 | Attending: Emergency Medicine | Admitting: Emergency Medicine

## 2018-08-29 ENCOUNTER — Encounter (HOSPITAL_COMMUNITY): Payer: Self-pay | Admitting: Emergency Medicine

## 2018-08-29 ENCOUNTER — Emergency Department (HOSPITAL_COMMUNITY): Payer: 59

## 2018-08-29 ENCOUNTER — Other Ambulatory Visit: Payer: Self-pay

## 2018-08-29 DIAGNOSIS — R0602 Shortness of breath: Secondary | ICD-10-CM | POA: Diagnosis not present

## 2018-08-29 DIAGNOSIS — R0789 Other chest pain: Secondary | ICD-10-CM | POA: Insufficient documentation

## 2018-08-29 DIAGNOSIS — R110 Nausea: Secondary | ICD-10-CM | POA: Insufficient documentation

## 2018-08-29 LAB — BASIC METABOLIC PANEL
Anion gap: 7 (ref 5–15)
BUN: 12 mg/dL (ref 6–20)
CO2: 23 mmol/L (ref 22–32)
Calcium: 9 mg/dL (ref 8.9–10.3)
Chloride: 107 mmol/L (ref 98–111)
Creatinine, Ser: 0.91 mg/dL (ref 0.44–1.00)
GFR calc Af Amer: >60 mL/min (ref >60)
GFR calc non Af Amer: >60 mL/min (ref >60)
Glucose, Bld: 101 mg/dL — ABNORMAL HIGH (ref 70–99)
Potassium: 3.9 mmol/L (ref 3.5–5.1)
Sodium: 137 mmol/L (ref 135–145)

## 2018-08-29 LAB — CBC WITH DIFFERENTIAL/PLATELET
Abs Immature Granulocytes: 0.02 10*3/uL (ref 0.00–0.07)
Basophils Absolute: 0.1 10*3/uL (ref 0.0–0.1)
Basophils Relative: 1 %
Eosinophils Absolute: 0.2 10*3/uL (ref 0.0–0.5)
Eosinophils Relative: 2 %
HCT: 39 % (ref 36.0–46.0)
Hemoglobin: 13.3 g/dL (ref 12.0–15.0)
Immature Granulocytes: 0 %
Lymphocytes Relative: 24 %
Lymphs Abs: 2.3 10*3/uL (ref 0.7–4.0)
MCH: 29.7 pg (ref 26.0–34.0)
MCHC: 34.1 g/dL (ref 30.0–36.0)
MCV: 87.1 fL (ref 80.0–100.0)
Monocytes Absolute: 0.7 10*3/uL (ref 0.1–1.0)
Monocytes Relative: 7 %
Neutro Abs: 6.4 10*3/uL (ref 1.7–7.7)
Neutrophils Relative %: 66 %
Platelets: 229 10*3/uL (ref 150–400)
RBC: 4.48 MIL/uL (ref 3.87–5.11)
RDW: 12.1 % (ref 11.5–15.5)
WBC: 9.7 10*3/uL (ref 4.0–10.5)
nRBC: 0 % (ref 0.0–0.2)

## 2018-08-29 LAB — TROPONIN I (HIGH SENSITIVITY): Troponin I (High Sensitivity): <2 ng/L (ref <18)

## 2018-08-29 LAB — I-STAT BETA HCG BLOOD, ED (MC, WL, AP ONLY): I-stat hCG, quantitative: <5 m[IU]/mL (ref <5)

## 2018-08-29 NOTE — ED Notes (Signed)
Patient verbalizes understanding of discharge instructions. Opportunity for questioning and answers were provided. Armband removed by staff, pt discharged from ED.  

## 2018-08-29 NOTE — Discharge Instructions (Signed)
Your marker for heart damage was negative.  Please follow-up with your family doctor in the office.

## 2018-08-29 NOTE — ED Triage Notes (Signed)
Patient reports her chest has been feeling "heavy, like its going to beat out of by chest" feeling weak, stating she was having a hard time catching her breathe, denies stress, reports feeling nausea.  First degree relative cardiovascular events.

## 2018-08-29 NOTE — ED Provider Notes (Signed)
[facility] Provider Note   CSN: [id number] Arrival date & time: 08/29/18  1014    History   Chief Complaint Chief Complaint  Patient presents with  . Chest Pain    HPI Alexandra Oneal is a 28 y.o. female.     28 yo F with a chief complaint of chest pain.  Feels like a pressure.  Going on for about 3 days.  Nothing seems to make it better or worse.  Seems to come and go at random.  Not related with food not related with exertion.  Feels like she is short of breath with it.  No diaphoresis or vomiting.  Has had some nausea off and on.  Otherwise feels generally fatigued.  Mom has a history of a heart attack in her early 7940s.  No heart attack in siblings or father.  No medical history that she knows of.  Denies smoking history.  Denies history of PE or DVT denies hemoptysis denies unilateral lower extremity edema denies recent surgery or immobilization or estrogen use.  The history is provided by the patient.  Chest Pain Chest pain location: Diffuse across the anterior chest wall. Pain quality: pressure   Pain radiates to:  Does not radiate Pain severity:  Moderate Onset quality:  Gradual Duration:  3 days Timing:  Constant Progression:  Worsening Chronicity:  New Relieved by:  Nothing Worsened by:  Nothing Ineffective treatments:  None tried Associated symptoms: nausea and shortness of breath   Associated symptoms: no dizziness, no fever, no headache, no palpitations and no vomiting     Past Medical History:  Diagnosis Date  . Medical history non-contributory   . Supervision of normal pregnancy in first trimester 04/18/2013    Patient Active Problem List   Diagnosis Date Noted  . Implanon removal 07/08/2018  . Contraception management 07/08/2018    Past Surgical History:  Procedure Laterality Date  . NO PAST SURGERIES       OB History    Gravida  1   Para  1   Term  1   Preterm      AB      Living  1     SAB       TAB      Ectopic      Multiple      Live Births  1            Home Medications    Prior to Admission medications   Medication Sig Start Date End Date Taking? Authorizing Provider  medroxyPROGESTERone (DEPO-PROVERA) 150 MG/ML injection Inject 1 mL (150 mg total) into the muscle every 3 (three) months. 07/08/18  Yes Hermina StaggersErvin, Michael L, MD    Family History Family History  Problem Relation Age of Onset  . Heart disease Mother        heart attack  . Hypertension Mother   . Diabetes Mother   . Cancer Maternal Grandmother        breast  . Diabetes Maternal Grandmother   . Heart disease Maternal Grandfather        heart attack  . Alcohol abuse Paternal Grandfather   . Mental illness Sister        severe bipolar  . Mental illness Sister     Social History Social History   Tobacco Use  . Smoking status: Never Smoker  . Smokeless tobacco: Never Used  Substance Use Topics  . Alcohol use: No    Comment:  occassional   . Drug use: No     Allergies   Patient has no known allergies.   Review of Systems Review of Systems  Constitutional: Negative for chills and fever.  HENT: Negative for congestion and rhinorrhea.   Eyes: Negative for redness and visual disturbance.  Respiratory: Positive for shortness of breath. Negative for wheezing.   Cardiovascular: Positive for chest pain. Negative for palpitations.  Gastrointestinal: Positive for nausea. Negative for vomiting.  Genitourinary: Negative for dysuria and urgency.  Musculoskeletal: Negative for arthralgias and myalgias.  Skin: Negative for pallor and wound.  Neurological: Negative for dizziness and headaches.     Physical Exam Updated Vital Signs BP (!) 143/90 (BP Location: Right Arm)   Pulse 84   Temp 98.5 F (36.9 C) (Axillary)   Resp 16   Ht 5\' 6"  (1.676 m)   Wt 90 kg   SpO2 99%   BMI 32.02 kg/m   Physical Exam Vitals signs and nursing note reviewed.  Constitutional:      General: She is not  in acute distress.    Appearance: She is well-developed. She is not diaphoretic.  HENT:     Head: Normocephalic and atraumatic.  Eyes:     Pupils: Pupils are equal, round, and reactive to light.  Neck:     Musculoskeletal: Normal range of motion and neck supple.  Cardiovascular:     Rate and Rhythm: Normal rate and regular rhythm.     Heart sounds: No murmur. No friction rub. No gallop.   Pulmonary:     Effort: Pulmonary effort is normal.     Breath sounds: No wheezing or rales.  Chest:     Chest wall: Tenderness present.     Comments: Discomfort with palpation of the anterior chest wall Abdominal:     General: There is no distension.     Palpations: Abdomen is soft.     Tenderness: There is no abdominal tenderness.  Musculoskeletal:        General: No tenderness.  Skin:    General: Skin is warm and dry.  Neurological:     Mental Status: She is alert and oriented to person, place, and time.  Psychiatric:        Behavior: Behavior normal.      ED Treatments / Results  Labs (all labs ordered are listed, but only abnormal results are displayed) Labs Reviewed  BASIC METABOLIC PANEL - Abnormal; Notable for the following components:      Result Value   Glucose, Bld 101 (*)    All other components within normal limits  CBC WITH DIFFERENTIAL/PLATELET  TROPONIN I (HIGH SENSITIVITY)  I-STAT BETA HCG BLOOD, ED ([facility], [facility], [facility] ONLY)    EKG EKG Interpretation  Date/Time:  Thursday August 29 2018 10:22:32 EDT Ventricular Rate:  88 PR Interval:    QRS Duration: 89 QT Interval:  376 QTC Calculation: 455 R Axis:   47 Text Interpretation:  Sinus rhythm Low voltage, precordial leads Borderline T abnormalities, inferior leads Baseline wander in lead(s) II III aVL aVF V3 V4 V5 V6 No old tracing to compare Confirmed by Melene PlanFloyd, Elverta Dimiceli [phone]([id number]) on 08/29/2018 10:39:11 AM   Radiology Dg Chest 2 View  Result Date: 08/29/2018 CLINICAL DATA:  Shortness of breath and lethargy EXAM: CHEST - 2 VIEW  COMPARISON:  None. FINDINGS: Lungs are clear. Heart size and pulmonary vascularity are normal. No adenopathy. No bone lesions. IMPRESSION: No edema or consolidation. Electronically Signed   By: Bretta BangWilliam  Woodruff III  M.D.   On: 08/29/2018 11:26    Procedures Procedures (including critical care time)  Medications Ordered in ED Medications - No data to display   Initial Impression / Assessment and Plan / ED Course  I have reviewed the triage vital signs and the nursing notes.  Pertinent labs & imaging results that were available during my care of the patient were reviewed by me and considered in my medical decision making (see chart for details).        28 yo F with a chief complaint of chest pain.  This is atypical in nature.  The only concerning feature of her history is that her mother had a heart attack in her early 63s.  Will obtain a single troponin as she has had pain going on for greater than 48 hours.  EKG with baseline wander otherwise unremarkable.  Patient's troponin is negative.  Basic lab work also unremarkable no anemia no electrolyte abnormality.  Most likely musculoskeletal.  PCP follow-up.  2:06 PM:  I have discussed the diagnosis/risks/treatment options with the patient and believe the pt to be eligible for discharge home to follow-up with PCP. We also discussed returning to the ED immediately if new or worsening sx occur. We discussed the sx which are most concerning (e.g., sudden worsening pain, fever, inability to tolerate by mouth) that necessitate immediate return. Medications administered to the patient during their visit and any new prescriptions provided to the patient are listed below.  Medications given during this visit Medications - No data to display   The patient appears reasonably screen and/or stabilized for discharge and I doubt any other medical condition or other [facility] requiring further screening, evaluation, or treatment in the ED at this time prior to  discharge.    Final Clinical Impressions(s) / ED Diagnoses   Final diagnoses:  Atypical chest pain    ED Discharge Orders    None       Deno Etienne, DO 08/29/18 1406

## 2018-09-10 ENCOUNTER — Other Ambulatory Visit: Payer: Self-pay | Admitting: Obstetrics & Gynecology

## 2018-09-17 ENCOUNTER — Telehealth: Payer: Self-pay | Admitting: Obstetrics and Gynecology

## 2018-09-17 NOTE — Telephone Encounter (Signed)

## 2018-09-18 ENCOUNTER — Other Ambulatory Visit: Payer: Self-pay | Admitting: Obstetrics and Gynecology

## 2018-09-18 ENCOUNTER — Other Ambulatory Visit: Payer: Self-pay | Admitting: Adult Health

## 2018-10-02 ENCOUNTER — Telehealth: Payer: Self-pay | Admitting: Obstetrics and Gynecology

## 2018-10-02 NOTE — Telephone Encounter (Signed)

## 2018-10-03 ENCOUNTER — Other Ambulatory Visit: Payer: Self-pay

## 2018-10-03 ENCOUNTER — Ambulatory Visit (INDEPENDENT_AMBULATORY_CARE_PROVIDER_SITE_OTHER): Payer: 59

## 2018-10-03 DIAGNOSIS — Z3042 Encounter for surveillance of injectable contraceptive: Secondary | ICD-10-CM | POA: Diagnosis not present

## 2018-10-03 MED ORDER — MEDROXYPROGESTERONE ACETATE 150 MG/ML IM SUSP
150.0000 mg | Freq: Once | INTRAMUSCULAR | Status: AC
  Administered 2018-10-03: 150 mg via INTRAMUSCULAR

## 2018-10-03 NOTE — Progress Notes (Signed)
Pt here for depo injection 150 mg IM given RT deltoid. Tolerated well. Return 12 weeks for next injection. Pad CMA

## 2018-12-25 ENCOUNTER — Telehealth: Payer: Self-pay | Admitting: Obstetrics and Gynecology

## 2018-12-25 NOTE — Telephone Encounter (Signed)

## 2018-12-26 ENCOUNTER — Ambulatory Visit: Payer: 59

## 2019-05-30 ENCOUNTER — Ambulatory Visit: Payer: 59 | Attending: Internal Medicine

## 2019-06-14 ENCOUNTER — Encounter (HOSPITAL_COMMUNITY): Payer: Self-pay

## 2019-06-14 ENCOUNTER — Ambulatory Visit (HOSPITAL_COMMUNITY)
Admission: EM | Admit: 2019-06-14 | Discharge: 2019-06-14 | Disposition: A | Discharge status: Home / Self Care | Payer: 59 | Attending: Emergency Medicine | Admitting: Emergency Medicine

## 2019-06-14 ENCOUNTER — Ambulatory Visit (INDEPENDENT_AMBULATORY_CARE_PROVIDER_SITE_OTHER): Payer: 59

## 2019-06-14 ENCOUNTER — Other Ambulatory Visit: Payer: Self-pay

## 2019-06-14 DIAGNOSIS — W1840XA Slipping, tripping and stumbling without falling, unspecified, initial encounter: Secondary | ICD-10-CM

## 2019-06-14 DIAGNOSIS — S92352A Displaced fracture of fifth metatarsal bone, left foot, initial encounter for closed fracture: Secondary | ICD-10-CM

## 2019-06-14 HISTORY — DX: Anxiety disorder, unspecified: F41.9

## 2019-06-14 MED ORDER — HYDROCODONE-ACETAMINOPHEN 5-325 MG PO TABS: 1.0000 | ORAL_TABLET | Freq: Four times a day (QID) | ORAL | 0 refills | Status: DC | PRN

## 2019-06-14 NOTE — ED Provider Notes (Signed)
HPI  SUBJECTIVE:  Alexandra Oneal is a 29 y.o. female who presents with throbbing left lateral foot pain, swelling, bruising after rolling her left foot 2 weeks ago while working in the yard.  She reports hot burning constant pain in the lateral foot that is getting worse over the past week.  She reports numbness and tingling in this area.  States the pain goes up into her ankle, but denies known ankle injury.  No erythema.  No body aches fevers.  Skin is intact.  She has tried ice, elevation, alternating 1000 mg of Tylenol and 400 mg of ibuprofen every 4 hours without improvement in her symptoms.  Symptoms are worse with weightbearing and wearing her steel toed boots which is required for work.  Past medical history negative for diabetes, neuropathy, smoking, immunocompromise.  LMP: 1 month ago.  Denies possibility being pregnant.  YQM:VHQ[facility]GE, [facility]   Past Medical History:  Diagnosis Date  . Anxiety   . Medical history non-contributory   . Supervision of normal pregnancy in first trimester 04/18/2013    Past Surgical History:  Procedure Laterality Date  . NO PAST SURGERIES      Family History  Problem Relation Age of Onset  . Heart disease Mother        heart attack  . Hypertension Mother   . Diabetes Mother   . Cancer Maternal Grandmother        breast  . Diabetes Maternal Grandmother   . Heart disease Maternal Grandfather        heart attack  . Alcohol abuse Paternal Grandfather   . Mental illness Sister        severe bipolar  . Mental illness Sister     Social History   Tobacco Use  . Smoking status: Never Smoker  . Smokeless tobacco: Never Used  Substance Use Topics  . Alcohol use: Yes    Comment: occassional   . Drug use: No    No current facility-administered medications for this encounter.  Current Outpatient Medications:  .  levonorgestrel (MIRENA) 20 MCG/24HR IUD, 1 each by Intrauterine route once., Disp: , Rfl:  .  Multiple Vitamin  (MULTIVITAMIN WITH MINERALS) TABS tablet, Take 1 tablet by mouth daily., Disp: , Rfl:  .  FLUoxetine (PROZAC) 20 MG capsule, Take 40 mg by mouth daily., Disp: , Rfl:  .  HYDROcodone-acetaminophen (NORCO/VICODIN) 5-325 MG tablet, Take 1-2 tablets by mouth every 6 (six) hours as needed for moderate pain or severe pain., Disp: 12 tablet, Rfl: 0  No Known Allergies   ROS  As noted in HPI.   Physical Exam  BP 116/80   Pulse 78   Temp 98.7 F (37.1 C) (Oral)   Resp 16   Ht 5\' 6"  (1.676 m)   Wt 86.2 kg   LMP 05/05/2019 (Exact Date) Comment: Denies pregnancy, refused pregnancy test  SpO2 97%   BMI 30.67 kg/m   Constitutional: Well developed, well nourished, no acute distress Eyes:  EOMI, conjunctiva normal bilaterally HENT: Normocephalic, atraumatic,mucus membranes moist Respiratory: Normal inspiratory effort Cardiovascular: Normal rate GI: nondistended skin: No rash, skin intact Musculoskeletal:  Left midfoot NT.  Diffuse tenderness over the fourth and fifth metatarsals.  Positive bruising lateral foot at the distal fifth metatarsal. Skin intact. DP 2+. Refill less than 2 seconds. Sensation grossly intact. Patient with difficulty moving fourth and fifth toes.  Pain  with inversion / eversion,  dorsiflexion / plantarflexion.  No Ecchymosis along the plantar aspect of the  foot.  No Tenderness along the plantar fascia. Distal fibula NT, Medial malleolus NT,  Deltoid ligament NT, Lateral ligaments NT, Achilles NT. Patient able to bear weight while in department.  Neurologic: Alert & oriented x 3, no focal neuro deficits Psychiatric: Speech and behavior appropriate   ED Course   Medications - No data to display  Orders Placed This Encounter  Procedures  . DG Foot Complete Left    Standing Status:   Standing    Number of Occurrences:   1    Order Specific Question:   Reason for Exam (SYMPTOM  OR DIAGNOSIS REQUIRED)    Answer:   trauma 2 weeks ago. tenderness 4th 5th MT r/o fx  .  Apply cam walker    Standing Status:   Standing    Number of Occurrences:   1    Order Specific Question:   Laterality    Answer:   Left  . Maintain cam walker    Standing Status:   Standing    Number of Occurrences:   1    Order Specific Question:   Laterality    Answer:   Left    No results found for this or any previous visit (from the past 24 hour(s)). DG Foot Complete Left  Result Date: 06/14/2019 CLINICAL DATA:  Stepped, rolled foot, pain and swelling EXAM: LEFT FOOT - COMPLETE 3+ VIEW COMPARISON:  None. FINDINGS: Oblique fracture of the distal fifth metatarsal shaft, with approximate 1 mm medial displacement of distal fracture fragment, no significant angulation or override. Overlying soft tissue swelling. Otherwise normal mineralization and alignment. Calcaneal spurs. No other significant osseous degenerative change. IMPRESSION: Minimally displaced distal fifth metatarsal shaft fracture. Electronically Signed   By: Lucrezia Europe M.D.   On: 06/14/2019 15:28    ED Clinical Impression  1. Displaced fracture of fifth metatarsal bone, left foot, initial encounter for closed fracture      ED Assessment/Plan  [place] Narcotic database reviewed for this patient, and feel that the risk/benefit ratio today is favorable for proceeding with a prescription for controlled substance.  [place] in October 2020.  Reviewed imaging independently.  Minimally displaced fifth metatarsal fracture,  see radiology report for full details.  No evidence of Lisfranc or midfoot fracture.  No neurovascular compromise.  She has a fifth metatarsal fracture.  Will place in a cam walker and refer to Dr. Ninfa Linden at [place].  Patient states that she is required to wear steel toed boots at work.  Will write a work note for Sunday Monday and have her follow-up with orthopedics for further management.  If she requires short-term disability.  She has Tuesday and Wednesday off.  Home with a Tylenol containing  product 3 or 4 times a day.  1000 mg Tylenol for mild to moderate pain, Norco for severe pain. Continue ice, elevation.   Discussed imaging, MDM, treatment plan, and plan for follow-up with patient.  Discussed signs and symptoms that should prompt return to the emergency department. patient agrees with plan.   Meds ordered this encounter  Medications  . HYDROcodone-acetaminophen (NORCO/VICODIN) 5-325 MG tablet    Sig: Take 1-2 tablets by mouth every 6 (six) hours as needed for moderate pain or severe pain.    Dispense:  12 tablet    Refill:  0    *This clinic note was created using [employer]. Therefore, there may be occasional mistakes despite careful proofreading.   ?    Melynda Ripple, MD 06/14/19  1757  

## 2019-06-14 NOTE — Discharge Instructions (Signed)
You may take a Tylenol containing product 3 or 4 times a day as needed for pain. 1000 mg Tylenol for mild to moderate pain, 1-2 Norco for severe pain.  No ibuprofen because that can impair bone healing.  Continue ice, elevation.  Cam walker and activity as tolerated.  Please follow-up with orthopedics as soon as you possibly can.

## 2019-06-14 NOTE — ED Triage Notes (Signed)
Pt states she was in the yard and twisted her left footx2 wks ago. Pt has 1+ edema of left foot, 2+ left pedal pulse, cap refill less than 3 sec, pt able to move foot, warm to touch. Pt c/o 8/10 burning pain of left foot.

## 2019-06-18 ENCOUNTER — Other Ambulatory Visit: Payer: Self-pay

## 2019-06-18 ENCOUNTER — Encounter: Payer: Self-pay | Admitting: Orthopaedic Surgery

## 2019-06-18 ENCOUNTER — Ambulatory Visit: Payer: 59 | Admitting: Orthopaedic Surgery

## 2019-06-18 DIAGNOSIS — S92352A Displaced fracture of fifth metatarsal bone, left foot, initial encounter for closed fracture: Secondary | ICD-10-CM | POA: Diagnosis not present

## 2019-06-18 MED ORDER — ONDANSETRON 4 MG PO TBDP: 4.0000 mg | ORAL_TABLET | Freq: Three times a day (TID) | ORAL | 0 refills | Status: AC | PRN

## 2019-06-18 MED ORDER — HYDROCODONE-ACETAMINOPHEN 5-325 MG PO TABS: 1.0000 | ORAL_TABLET | Freq: Four times a day (QID) | ORAL | 0 refills | Status: AC | PRN

## 2019-06-18 NOTE — Progress Notes (Signed)
Office Visit Note   Patient: Alexandra Oneal           Date of Birth: [birth year]           MRN: [id number] Visit Date: 06/18/2019              Requested by: Practice, [address] [place],  [place] [id number] PCP: Practice, [facility]   Assessment & Plan: Visit Diagnoses:  1. Closed displaced fracture of fifth metatarsal bone of left foot, initial encounter     Plan: I went over the patient's x-rays with her in detail.  She understands this is a stable fracture of her left fifth metatarsal.  We will give her a postoperative shoe to transition to when she is comfortable to do so.  I will give her a note to keep her out of work the next 4 to 6 weeks.  I would like to see her back in 3 weeks with a repeat 3 views of her left foot.  We will send in some hydrocodone and Zofran for her.  All questions and concerns were answered and addressed.  Follow-Up Instructions: Return in about 3 weeks (around 07/09/2019).   Orders:  No orders of the defined types were placed in this encounter.  Meds ordered this encounter  Medications  . HYDROcodone-acetaminophen (NORCO/VICODIN) 5-325 MG tablet    Sig: Take 1 tablet by mouth every 6 (six) hours as needed for moderate pain or severe pain.    Dispense:  30 tablet    Refill:  0  . ondansetron (ZOFRAN ODT) 4 MG disintegrating tablet    Sig: Take 1 tablet (4 mg total) by mouth every 8 (eight) hours as needed for nausea or vomiting.    Dispense:  20 tablet    Refill:  0      Procedures: No procedures performed   Clinical Data: No additional findings.   Subjective: Chief Complaint  Patient presents with  . Left Foot - Fracture  The patient is a very pleasant 29 year old female referred after injuring her left foot 2 and half weeks ago when she rolled her foot.  She went to an urgent care on 06/14/2019 and x-rays were obtained showing 1/5 metatarsal shaft fracture.  She was placed in a cam walking boot.  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  That is very uncomfortable for her to do that.  She is more comfortable in the cam walking boot.  She has been elevating and icing her foot.  She still having you consider amount of pain is well.  She is not a diabetic and not a smoker.  She is never injured this foot before.  HPI  Review of Systems She currently denies any headache, chest pain, shortness of breath, fever, chills, nausea, vomiting  Objective: Vital Signs: There were no vitals taken for this visit.  Physical Exam She is alert and orient x3 in no acute distress Ortho Exam Examination of her left foot does show pain along the fifth metatarsal.  The remainder of her foot exam is normal. Specialty Comments:  No specialty comments available.  Imaging: No results found. X-rays on the canopy system independently reviewed of the left foot which are 3 views shows a minimally displaced fracture of the fifth metatarsal shaft.  There is no rotational malalignment.  The MTP joint of the fifth ray is well-maintained.  PMFS History: Patient Active Problem List   Diagnosis Date Noted  .  Implanon removal 07/08/2018  . Contraception management 07/08/2018   Past Medical History:  Diagnosis Date  . Anxiety   . Medical history non-contributory   . Supervision of normal pregnancy in first trimester 04/18/2013    Family History  Problem Relation Age of Onset  . Heart disease Mother        heart attack  . Hypertension Mother   . Diabetes Mother   . Cancer Maternal Grandmother        breast  . Diabetes Maternal Grandmother   . Heart disease Maternal Grandfather        heart attack  . Alcohol abuse Paternal Grandfather   . Mental illness Sister        severe bipolar  . Mental illness Sister     Past Surgical History:  Procedure Laterality Date  . NO PAST SURGERIES     Social History   Occupational History  . Not on file  Tobacco Use  . Smoking status: Never Smoker  . Smokeless tobacco: Never  Used  Substance and Sexual Activity  . Alcohol use: Yes    Comment: occassional   . Drug use: No  . Sexual activity: Yes    Birth control/protection: Condom, Injection

## 2019-07-09 ENCOUNTER — Ambulatory Visit (INDEPENDENT_AMBULATORY_CARE_PROVIDER_SITE_OTHER): Payer: 59 | Admitting: Orthopaedic Surgery

## 2019-07-09 ENCOUNTER — Other Ambulatory Visit: Payer: Self-pay

## 2019-07-09 ENCOUNTER — Encounter: Payer: Self-pay | Admitting: Orthopaedic Surgery

## 2019-07-09 ENCOUNTER — Ambulatory Visit: Payer: Self-pay

## 2019-07-09 DIAGNOSIS — S92352A Displaced fracture of fifth metatarsal bone, left foot, initial encounter for closed fracture: Secondary | ICD-10-CM

## 2019-07-09 NOTE — Progress Notes (Signed)
The patient is a very pleasant 29 year old female who is just over 3 weeks into a left foot fifth metatarsal shaft fracture.  She is in a cam walking boot.  She does have a postop shoe at home.  She has been having some burning pain.  The job that she works and requires her to wear steel toed boots.  She is still not been able to even get in to a tennis shoe due to foot swelling.  On exam her skin looks good on her left foot.  Her foot is well-perfused.  There is pain at the fracture site which is to be expected in just over 3 weeks after this injury but I can feel some callus formation.  There is no rotation deformities and otherwise her foot appears normal.  3 views of her left foot are reviewed.  1 can see early callus formation showing the fracture is starting to heal.  The MTP joint is well located and there is no rotation deformity and good alignment overall.  We will continue to need to keep her out of work at least 4-6 more weeks from today given the need to completely heal this fracture before being able to wear steel toed boots.  She can transition from the Cam walker to the postop shoe when she is comfortable.  All questions and concerns were answered and addressed.  I would like to see her back in 4 weeks with a repeat 3 views of her left foot.

## 2019-08-06 ENCOUNTER — Ambulatory Visit (INDEPENDENT_AMBULATORY_CARE_PROVIDER_SITE_OTHER): Payer: 59

## 2019-08-06 ENCOUNTER — Ambulatory Visit (INDEPENDENT_AMBULATORY_CARE_PROVIDER_SITE_OTHER): Payer: 59 | Admitting: Orthopaedic Surgery

## 2019-08-06 ENCOUNTER — Encounter: Payer: Self-pay | Admitting: Orthopaedic Surgery

## 2019-08-06 ENCOUNTER — Other Ambulatory Visit: Payer: Self-pay

## 2019-08-06 DIAGNOSIS — S92352A Displaced fracture of fifth metatarsal bone, left foot, initial encounter for closed fracture: Secondary | ICD-10-CM | POA: Diagnosis not present

## 2019-08-06 NOTE — Progress Notes (Signed)
The patient is now about 9 weeks status post injuring her left foot and sustaining 5th metatarsal shaft fracture.  She is in a job that requires her to stand all day long and wear steel toed boots.  We have had her out of work due to the nature of this injury.  She is still reporting some foot pain but does feel like there has been improvement.  She still has a hard time wearing regular shoes due to the pressure put on the 5th metatarsal.  She is not a diabetic and not a smoker.  On exam, her left foot shows minimal swelling.  Her foot is well-perfused with normal sensation.  She does report a burning sensation when she has been standing for too long on her foot.  There is pain to palpation along the shaft of the 5th metatarsal.  3 views of her left foot are obtained and reviewed as well as compared to previous films.  The fracture is not healed yet but there is significant interval healing.  At this point I will need to keep her out of work an additional 4 weeks before allowing her to hopefully return to full work duties and be able to comfortably wear steel toed boots and stand all day long.  In 4 weeks from now I would like to repeat 3 views of her left foot.  Again, we are needing to keep her out of work due to the fact the fracture is not healed yet.

## 2019-09-03 ENCOUNTER — Ambulatory Visit: Payer: 59 | Admitting: Orthopaedic Surgery

## 2019-09-17 ENCOUNTER — Ambulatory Visit (INDEPENDENT_AMBULATORY_CARE_PROVIDER_SITE_OTHER): Payer: 59

## 2019-09-17 ENCOUNTER — Encounter: Payer: Self-pay | Admitting: Orthopaedic Surgery

## 2019-09-17 ENCOUNTER — Other Ambulatory Visit: Payer: Self-pay

## 2019-09-17 ENCOUNTER — Ambulatory Visit (INDEPENDENT_AMBULATORY_CARE_PROVIDER_SITE_OTHER): Payer: 59 | Admitting: Orthopaedic Surgery

## 2019-09-17 DIAGNOSIS — S92352A Displaced fracture of fifth metatarsal bone, left foot, initial encounter for closed fracture: Secondary | ICD-10-CM | POA: Diagnosis not present

## 2019-09-17 NOTE — Progress Notes (Signed)
The the patient is getting close to 3 months status post 1/5 metatarsal shaft fracture of the left foot.  She is doing well overall.  There is some foot pain with weightbearing and some nerve type of burning pain but overall things seem to be getting a little better.  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  We have had her out of work because of that.  On exam her foot feels much better in terms of stress in the fracture site.  There is minimal pain but some of this is more of a burning nerve type of pain.  3 views of her left foot show that the fracture is healing significantly.  I am confident we can allow her to return to work August 2.  She will try Voltaren gel on her foot as well as continued anti-inflammatories.  Follow-up can be as needed.  All questions and concerns were answered and addressed.

## 2020-04-17 ENCOUNTER — Other Ambulatory Visit: Payer: Self-pay

## 2020-04-17 ENCOUNTER — Emergency Department (HOSPITAL_COMMUNITY)
Admission: EM | Admit: 2020-04-17 | Discharge: 2020-04-17 | Disposition: A | Discharge status: Home / Self Care | Payer: 59 | Attending: Emergency Medicine | Admitting: Emergency Medicine

## 2020-04-17 ENCOUNTER — Emergency Department (HOSPITAL_COMMUNITY): Payer: 59

## 2020-04-17 ENCOUNTER — Encounter (HOSPITAL_COMMUNITY): Payer: Self-pay | Admitting: Emergency Medicine

## 2020-04-17 DIAGNOSIS — S298XXA Other specified injuries of thorax, initial encounter: Secondary | ICD-10-CM

## 2020-04-17 DIAGNOSIS — R10814 Left lower quadrant abdominal tenderness: Secondary | ICD-10-CM | POA: Insufficient documentation

## 2020-04-17 DIAGNOSIS — S80812A Abrasion, left lower leg, initial encounter: Secondary | ICD-10-CM | POA: Insufficient documentation

## 2020-04-17 DIAGNOSIS — R55 Syncope and collapse: Secondary | ICD-10-CM | POA: Insufficient documentation

## 2020-04-17 DIAGNOSIS — Y92410 Unspecified street and highway as the place of occurrence of the external cause: Secondary | ICD-10-CM | POA: Diagnosis not present

## 2020-04-17 DIAGNOSIS — S299XXA Unspecified injury of thorax, initial encounter: Secondary | ICD-10-CM | POA: Insufficient documentation

## 2020-04-17 DIAGNOSIS — S161XXA Strain of muscle, fascia and tendon at neck level, initial encounter: Secondary | ICD-10-CM | POA: Insufficient documentation

## 2020-04-17 DIAGNOSIS — S199XXA Unspecified injury of neck, initial encounter: Secondary | ICD-10-CM | POA: Diagnosis present

## 2020-04-17 DIAGNOSIS — R000 Tachycardia, unspecified: Secondary | ICD-10-CM | POA: Insufficient documentation

## 2020-04-17 DIAGNOSIS — S5012XA Contusion of left forearm, initial encounter: Secondary | ICD-10-CM | POA: Diagnosis not present

## 2020-04-17 LAB — CBC
HCT: 41.5 % (ref 36.0–46.0)
Hemoglobin: 13.5 g/dL (ref 12.0–15.0)
MCH: 28.7 pg (ref 26.0–34.0)
MCHC: 32.5 g/dL (ref 30.0–36.0)
MCV: 88.3 fL (ref 80.0–100.0)
Platelets: 314 10*3/uL (ref 150–400)
RBC: 4.7 MIL/uL (ref 3.87–5.11)
RDW: 12.6 % (ref 11.5–15.5)
WBC: 12 10*3/uL — ABNORMAL HIGH (ref 4.0–10.5)
nRBC: 0 % (ref 0.0–0.2)

## 2020-04-17 LAB — BASIC METABOLIC PANEL
Anion gap: 9 (ref 5–15)
BUN: 15 mg/dL (ref 6–20)
CO2: 25 mmol/L (ref 22–32)
Calcium: 9.5 mg/dL (ref 8.9–10.3)
Chloride: 106 mmol/L (ref 98–111)
Creatinine, Ser: 0.92 mg/dL (ref 0.44–1.00)
GFR, Estimated: >60 mL/min (ref >60)
Glucose, Bld: 93 mg/dL (ref 70–99)
Potassium: 3.6 mmol/L (ref 3.5–5.1)
Sodium: 140 mmol/L (ref 135–145)

## 2020-04-17 LAB — I-STAT BETA HCG BLOOD, ED (MC, WL, AP ONLY): I-stat hCG, quantitative: <5 m[IU]/mL (ref <5)

## 2020-04-17 MED ORDER — METHOCARBAMOL 500 MG PO TABS: 500.0000 mg | ORAL_TABLET | Freq: Three times a day (TID) | ORAL | 0 refills | Status: AC | PRN

## 2020-04-17 MED ORDER — FENTANYL CITRATE (PF) 100 MCG/2ML IJ SOLN
50.0000 ug | Freq: Once | INTRAMUSCULAR | Status: AC
  Administered 2020-04-17: 50 ug via INTRAVENOUS
  Filled 2020-04-17: qty 2

## 2020-04-17 MED ORDER — IOHEXOL 300 MG/ML  SOLN
100.0000 mL | Freq: Once | INTRAMUSCULAR | Status: AC | PRN
  Administered 2020-04-17: 100 mL via INTRAVENOUS
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
## 2022-04-06 DIAGNOSIS — Z419 Encounter for procedure for purposes other than remedying health state, unspecified: Secondary | ICD-10-CM | POA: Diagnosis not present
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
## 2022-05-05 DIAGNOSIS — Z419 Encounter for procedure for purposes other than remedying health state, unspecified: Secondary | ICD-10-CM | POA: Diagnosis not present

## 2022-06-05 DIAGNOSIS — Z419 Encounter for procedure for purposes other than remedying health state, unspecified: Secondary | ICD-10-CM | POA: Diagnosis not present

## 2022-06-27 ENCOUNTER — Telehealth: Payer: Self-pay

## 2022-06-27 NOTE — Telephone Encounter (Signed)
LVM for patient to call back to schedule PCP apt. AS, CMA 

## 2022-07-05 DIAGNOSIS — Z419 Encounter for procedure for purposes other than remedying health state, unspecified: Secondary | ICD-10-CM | POA: Diagnosis not present

## 2022-08-05 DIAGNOSIS — Z419 Encounter for procedure for purposes other than remedying health state, unspecified: Secondary | ICD-10-CM | POA: Diagnosis not present

## 2022-09-04 DIAGNOSIS — Z419 Encounter for procedure for purposes other than remedying health state, unspecified: Secondary | ICD-10-CM | POA: Diagnosis not present

## 2022-10-05 DIAGNOSIS — Z419 Encounter for procedure for purposes other than remedying health state, unspecified: Secondary | ICD-10-CM | POA: Diagnosis not present

## 2022-11-05 DIAGNOSIS — Z419 Encounter for procedure for purposes other than remedying health state, unspecified: Secondary | ICD-10-CM | POA: Diagnosis not present

## 2022-12-05 DIAGNOSIS — Z419 Encounter for procedure for purposes other than remedying health state, unspecified: Secondary | ICD-10-CM | POA: Diagnosis not present

## 2023-01-05 DIAGNOSIS — Z419 Encounter for procedure for purposes other than remedying health state, unspecified: Secondary | ICD-10-CM | POA: Diagnosis not present
